# Patient Record
Sex: Male | Born: 1960 | Race: Black or African American | Hispanic: No | Marital: Married | Smoking: Never smoker
Health system: Southern US, Community
[De-identification: ages and names within clinical notes are randomized; demographics above are authoritative.]

## PROBLEM LIST (undated history)

## (undated) DIAGNOSIS — Z973 Presence of spectacles and contact lenses: Secondary | ICD-10-CM

## (undated) DIAGNOSIS — Z5189 Encounter for other specified aftercare: Secondary | ICD-10-CM

## (undated) DIAGNOSIS — M1990 Unspecified osteoarthritis, unspecified site: Secondary | ICD-10-CM

## (undated) DIAGNOSIS — Z974 Presence of external hearing-aid: Secondary | ICD-10-CM

## (undated) DIAGNOSIS — I10 Essential (primary) hypertension: Secondary | ICD-10-CM

## (undated) DIAGNOSIS — G709 Myoneural disorder, unspecified: Secondary | ICD-10-CM

## (undated) HISTORY — PX: CERVICAL DISCECTOMY: SHX98

## (undated) HISTORY — PX: ULNAR NERVE REPAIR: SHX2594

## (undated) HISTORY — PX: TONSILLECTOMY: SUR1361

## (undated) HISTORY — PX: CARPAL TUNNEL RELEASE: SHX101

## (undated) HISTORY — PX: TENDON REPAIR: SHX5111

## (undated) SURGERY — Surgical Case
Anesthesia: *Unknown

---

## 2008-11-28 ENCOUNTER — Encounter (INDEPENDENT_AMBULATORY_CARE_PROVIDER_SITE_OTHER): Payer: Self-pay | Admitting: Orthopedic Surgery

## 2008-11-28 ENCOUNTER — Ambulatory Visit: Payer: Self-pay | Admitting: Pulmonary Disease

## 2008-11-28 ENCOUNTER — Inpatient Hospital Stay (HOSPITAL_COMMUNITY): Admission: RE | Admit: 2008-11-28 | Discharge: 2008-12-03 | Payer: Self-pay | Admitting: Orthopedic Surgery

## 2008-12-22 ENCOUNTER — Inpatient Hospital Stay (HOSPITAL_COMMUNITY): Admission: EM | Admit: 2008-12-22 | Discharge: 2009-01-02 | Payer: Self-pay | Admitting: Emergency Medicine

## 2008-12-22 ENCOUNTER — Ambulatory Visit: Payer: Self-pay | Admitting: Infectious Diseases

## 2008-12-22 DIAGNOSIS — L089 Local infection of the skin and subcutaneous tissue, unspecified: Secondary | ICD-10-CM | POA: Insufficient documentation

## 2008-12-24 ENCOUNTER — Encounter (INDEPENDENT_AMBULATORY_CARE_PROVIDER_SITE_OTHER): Payer: Self-pay | Admitting: Orthopedic Surgery

## 2009-01-16 ENCOUNTER — Ambulatory Visit: Payer: Self-pay | Admitting: Infectious Diseases

## 2009-01-16 DIAGNOSIS — E119 Type 2 diabetes mellitus without complications: Secondary | ICD-10-CM | POA: Insufficient documentation

## 2009-01-17 ENCOUNTER — Encounter: Payer: Self-pay | Admitting: Infectious Diseases

## 2009-01-21 ENCOUNTER — Encounter: Payer: Self-pay | Admitting: Infectious Diseases

## 2009-02-04 ENCOUNTER — Telehealth: Payer: Self-pay | Admitting: Internal Medicine

## 2009-02-06 ENCOUNTER — Emergency Department (HOSPITAL_COMMUNITY): Admission: EM | Admit: 2009-02-06 | Discharge: 2009-02-06 | Payer: Self-pay | Admitting: Emergency Medicine

## 2009-03-11 ENCOUNTER — Ambulatory Visit: Payer: Self-pay | Admitting: Infectious Diseases

## 2009-03-11 LAB — CONVERTED CEMR LAB
Albumin: 4.4 g/dL (ref 3.5–5.2)
BUN: 14 mg/dL (ref 6–23)
Basophils Absolute: 0 10*3/uL (ref 0.0–0.1)
CO2: 25 meq/L (ref 19–32)
CRP: 0.5 mg/dL (ref <0.6)
Calcium: 9.5 mg/dL (ref 8.4–10.5)
Chloride: 104 meq/L (ref 96–112)
Glucose, Bld: 92 mg/dL (ref 70–99)
Hemoglobin: 12 g/dL — ABNORMAL LOW (ref 13.0–17.0)
Lymphocytes Relative: 29 % (ref 12–46)
Monocytes Absolute: 0.5 10*3/uL (ref 0.1–1.0)
Monocytes Relative: 8 % (ref 3–12)
Neutro Abs: 3.7 10*3/uL (ref 1.7–7.7)
Potassium: 4.6 meq/L (ref 3.5–5.3)
RBC: 4.34 M/uL (ref 4.22–5.81)
RDW: 17.4 % — ABNORMAL HIGH (ref 11.5–15.5)
Sed Rate: 14 mm/hr (ref 0–16)
WBC: 6.1 10*3/uL (ref 4.0–10.5)

## 2009-04-08 ENCOUNTER — Ambulatory Visit (HOSPITAL_COMMUNITY): Admission: RE | Admit: 2009-04-08 | Discharge: 2009-04-08 | Payer: Self-pay | Admitting: Orthopedic Surgery

## 2009-06-10 ENCOUNTER — Ambulatory Visit: Payer: Self-pay | Admitting: Infectious Diseases

## 2009-06-10 LAB — CONVERTED CEMR LAB
ALT: 21 units/L (ref 0–53)
AST: 24 units/L (ref 0–37)
Albumin: 4.6 g/dL (ref 3.5–5.2)
Alkaline Phosphatase: 94 units/L (ref 39–117)
Glucose, Bld: 96 mg/dL (ref 70–99)
Hemoglobin: 13.6 g/dL (ref 13.0–17.0)
Platelets: 261 10*3/uL (ref 150–400)
Potassium: 4.3 meq/L (ref 3.5–5.3)
RDW: 14.6 % (ref 11.5–15.5)
Sodium: 140 meq/L (ref 135–145)
Total Protein: 7.1 g/dL (ref 6.0–8.3)

## 2009-08-12 ENCOUNTER — Telehealth: Payer: Self-pay

## 2009-08-12 ENCOUNTER — Ambulatory Visit (HOSPITAL_COMMUNITY): Admission: RE | Admit: 2009-08-12 | Discharge: 2009-08-12 | Payer: Self-pay | Admitting: Orthopedic Surgery

## 2009-08-31 DIAGNOSIS — IMO0001 Reserved for inherently not codable concepts without codable children: Secondary | ICD-10-CM

## 2009-08-31 DIAGNOSIS — Z5189 Encounter for other specified aftercare: Secondary | ICD-10-CM

## 2009-08-31 HISTORY — DX: Reserved for inherently not codable concepts without codable children: IMO0001

## 2009-08-31 HISTORY — DX: Encounter for other specified aftercare: Z51.89

## 2009-11-11 ENCOUNTER — Encounter: Admission: RE | Admit: 2009-11-11 | Discharge: 2009-11-11 | Payer: Self-pay | Admitting: Orthopedic Surgery

## 2009-12-31 ENCOUNTER — Inpatient Hospital Stay (HOSPITAL_COMMUNITY): Admission: RE | Admit: 2009-12-31 | Discharge: 2010-01-03 | Payer: Self-pay | Admitting: Orthopedic Surgery

## 2010-01-01 ENCOUNTER — Ambulatory Visit: Payer: Self-pay | Admitting: Infectious Diseases

## 2010-03-02 IMAGING — CR DG THORACOLUMBAR SPINE 2V
1 series · 1 of 1 positions shown · non-contrast
Comparison: None

CLINICAL DATA: Lumbar stenosis and spondylosis.  T10-S1 PLIF and
TLIF

THORACOLUMBAR SPINE - 2 VIEW

[view not recorded]
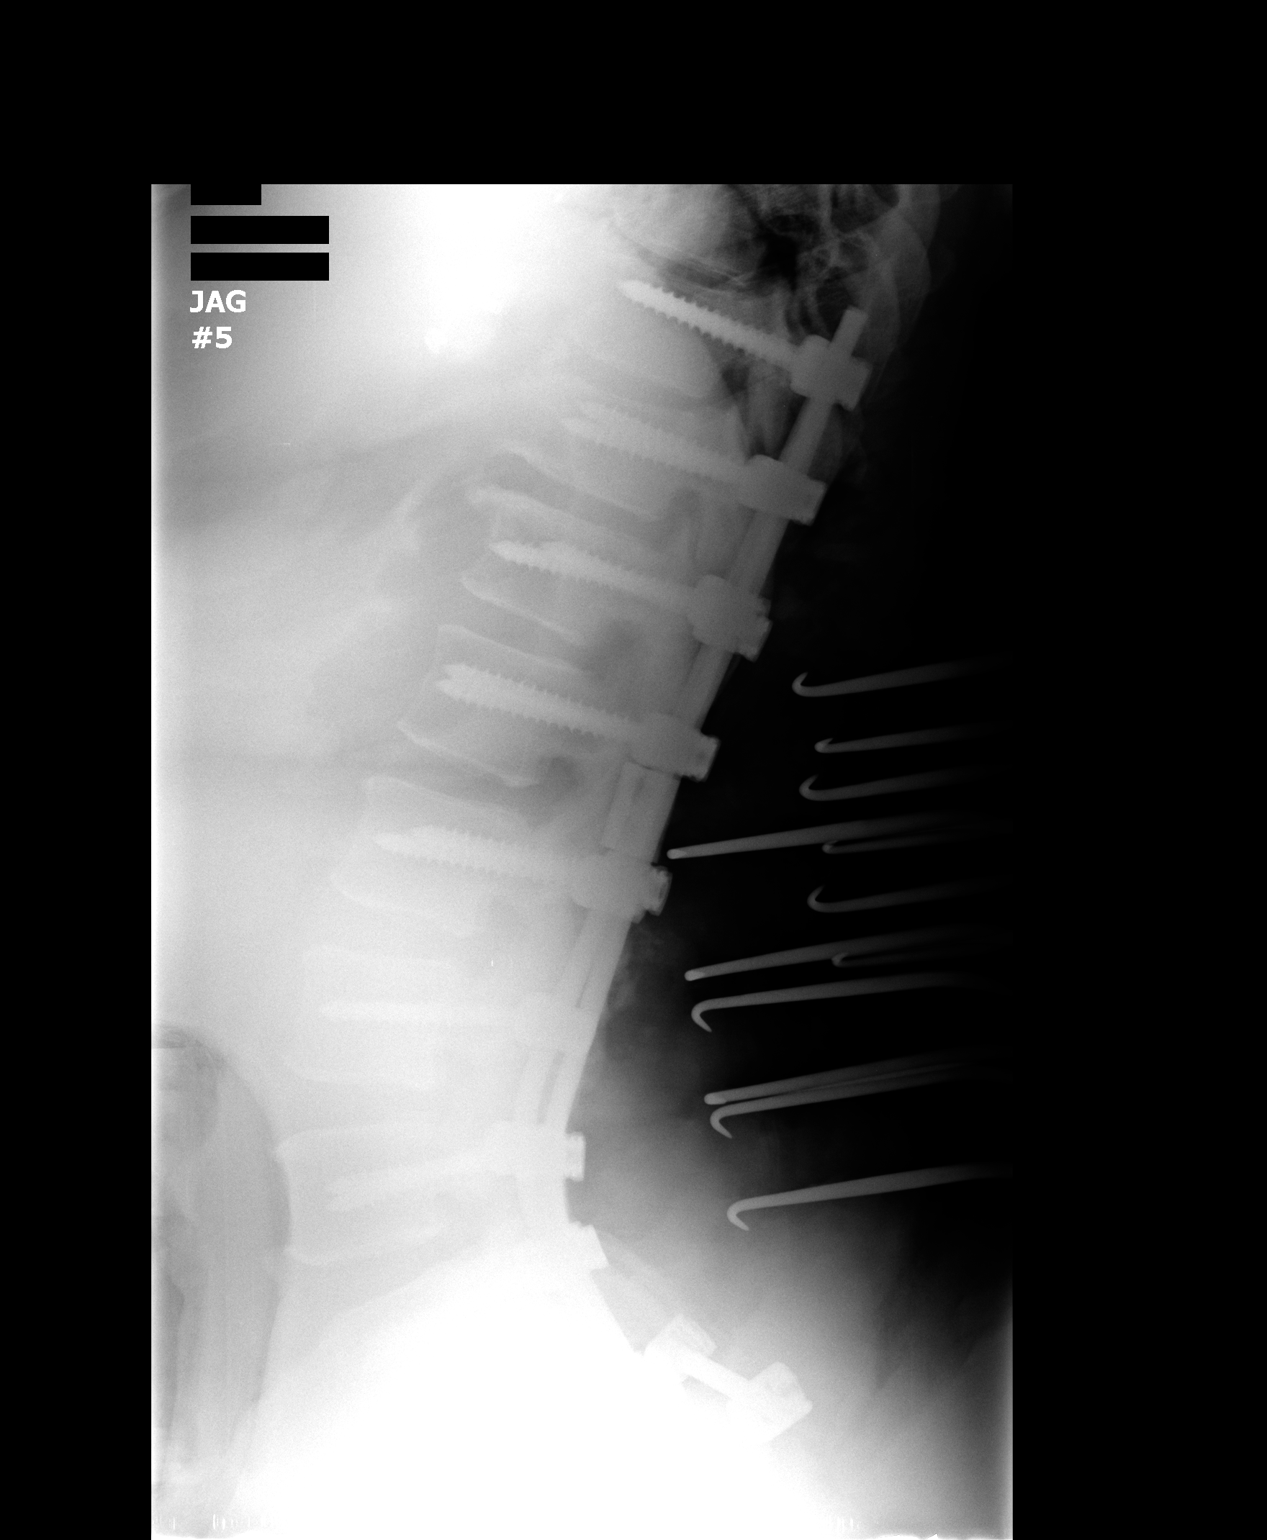

[1 of 1 positions shown; findings below may reference images not displayed]

FINDINGS: Seven radiographs were obtained during the procedure.
The final AP and lateral views reveal paired Transpedicle screws
and posterior interconnecting rods from T10-S1.  Hardware is
intact.  There appears be a separate rod just to the right of
midline extending from L1 to the superior S1 level.  There is a
horizontally oriented rod extending from the distal aspect of the
aforementioned rod to the right and connecting with the base of the
screw which projects over the right medial iliac bone.
IMPRESSION: Status post PLIF and TLIF from T10-S1.

## 2010-03-03 IMAGING — CR DG CHEST 1V PORT
1 series · 1 of 1 positions shown · non-contrast
Comparison: Earlier the same date and 11/28/2008.

CLINICAL DATA: Central line placement.

PORTABLE CHEST - 1 VIEW

[view not recorded]
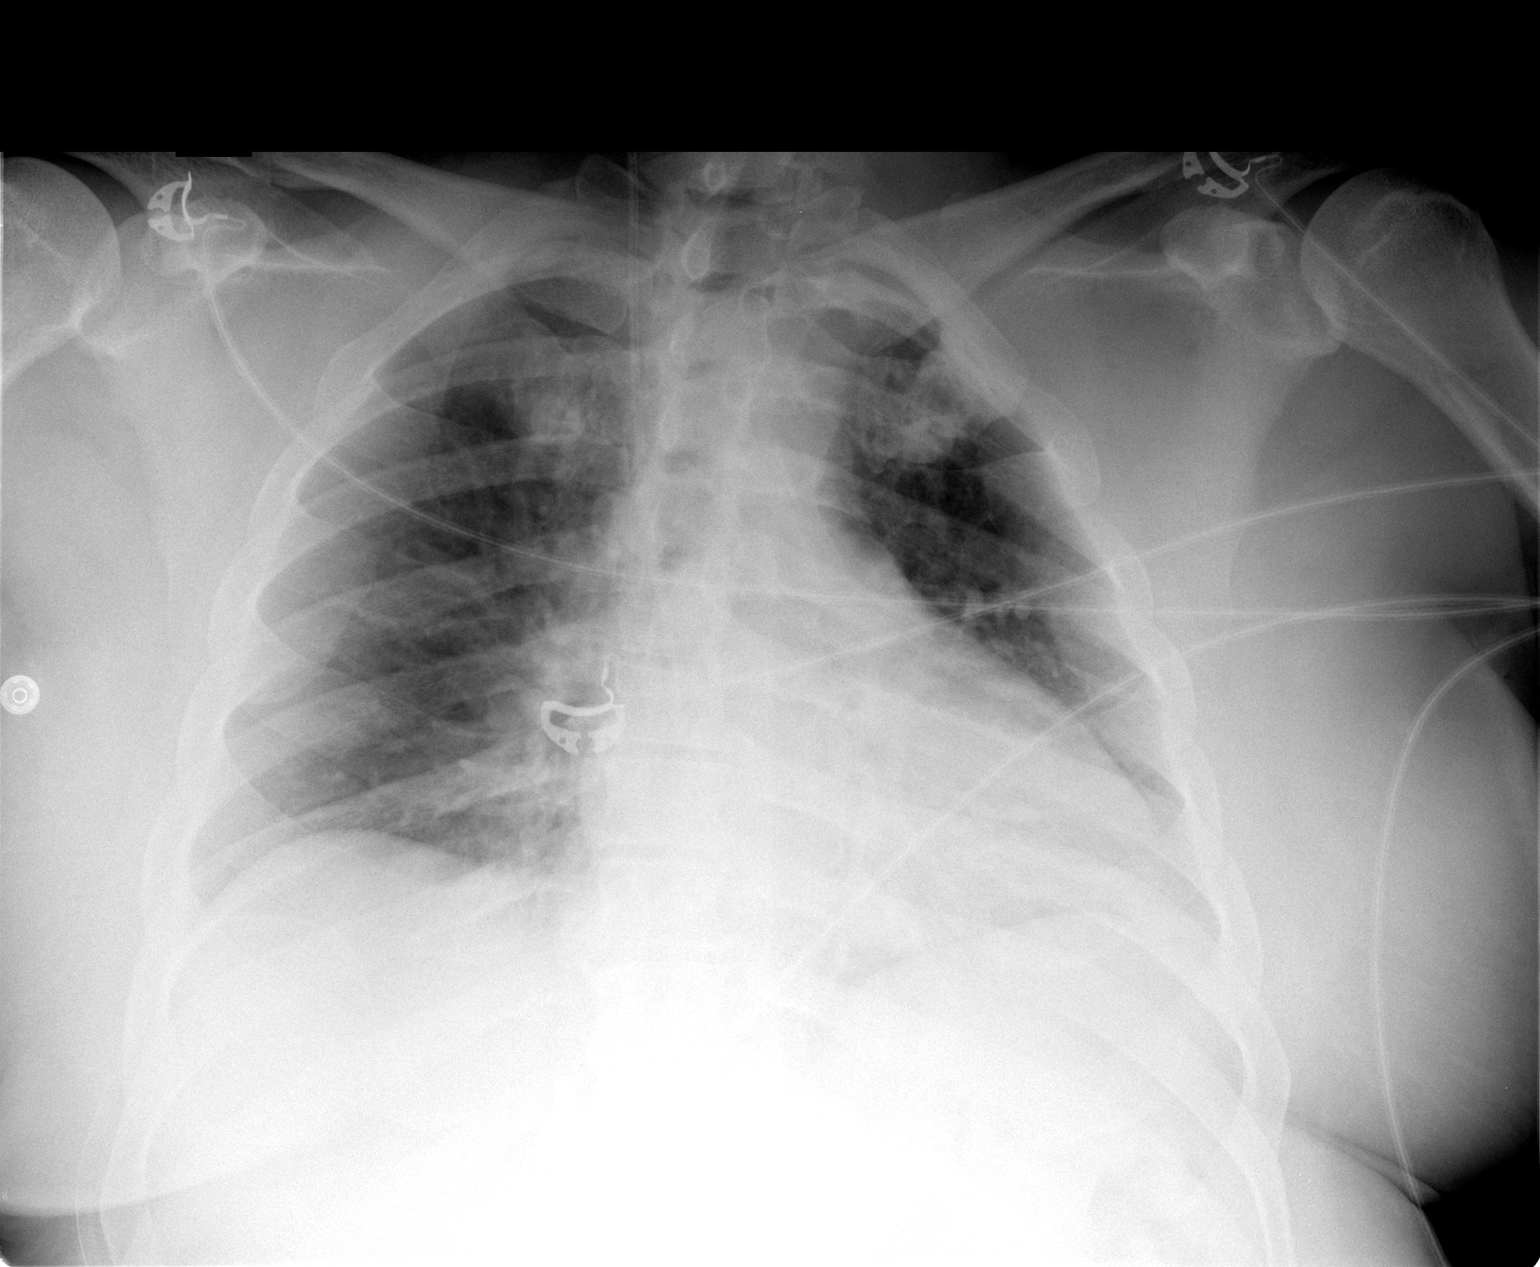

[1 of 1 positions shown; findings below may reference images not displayed]

FINDINGS: 1201 hours.  Right IJ central venous catheter tip is near
the SVC right atrial junction.  The heart size and mediastinal
contours are stable.  There is slightly increased atelectasis in
both lung bases.  No pneumothorax is evident. There has been
interval extubation and removal of the nasogastric tube.
IMPRESSION: Central line tip near the cavoatrial junction.  No pneumothorax.
Mildly increased basilar atelectasis following extubation.

## 2010-03-11 ENCOUNTER — Encounter: Payer: Self-pay | Admitting: Infectious Diseases

## 2010-03-24 ENCOUNTER — Ambulatory Visit: Payer: Self-pay | Admitting: Infectious Diseases

## 2010-03-27 IMAGING — CR DG CHEST 1V PORT
1 series · 1 of 1 positions shown · non-contrast
Comparison: Portable chest x-ray yesterday.

CLINICAL DATA: Bedside PICC placement.  Wound infection.

PORTABLE CHEST - 1 VIEW [DATE]/7525 2655 hours:

[view not recorded]
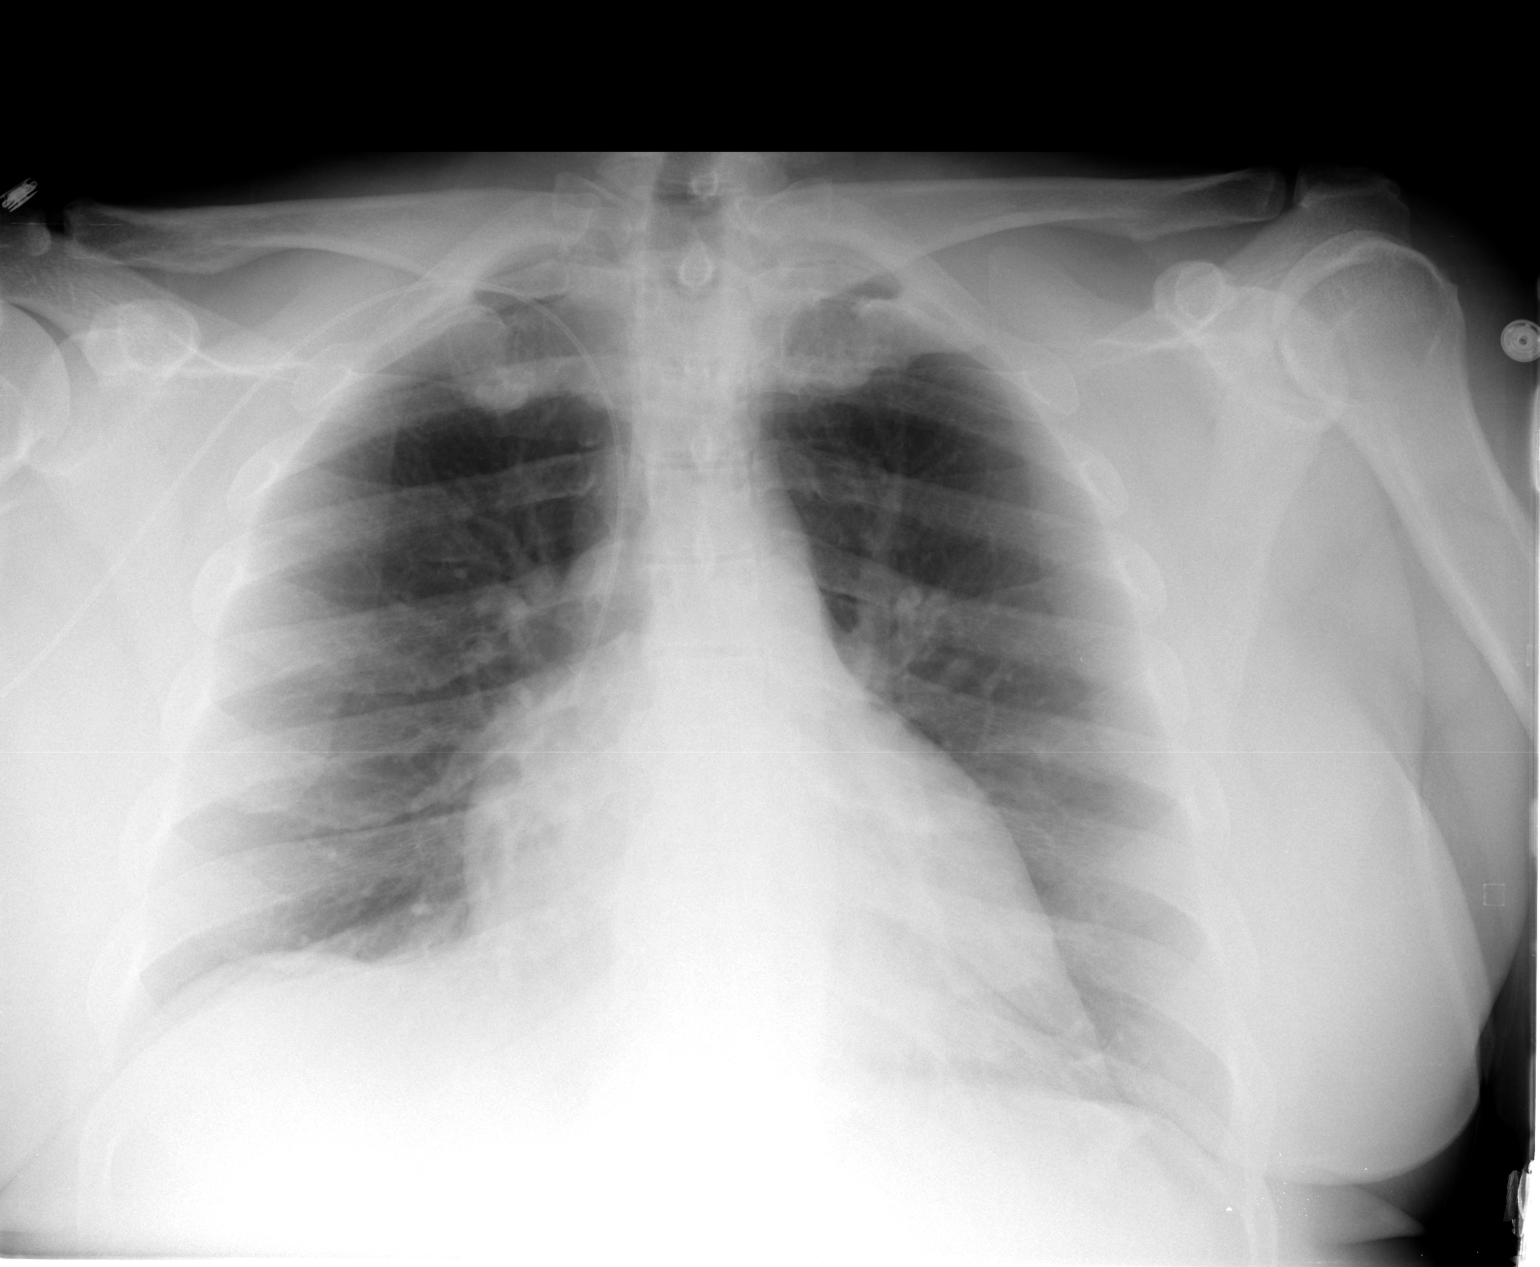

[1 of 1 positions shown; findings below may reference images not displayed]

FINDINGS: Right arm PICC tip in the SVC.  Heart size upper normal
and stable.  Lungs clear.  No pleural effusions.
IMPRESSION: 1.  Right arm PICC tip in the SVC.
2.  No acute cardiopulmonary disease.

## 2010-03-27 IMAGING — US IR US GUIDANCE
1 series · 4 of 4 positions shown · non-contrast
Comparison: MRI dated 12/22/2008

CLINICAL DATA: 47-year-old with fevers and status post
thoracolumbar spine fusion.  Recent MRI demonstrated a large fluid
collection posterior to the spine and consistent with a epidural
fluid collection.

ULTRASOUND GUIDED ASPIRATION

[Series 1: ir us guidance · 4 of 4 slices shown]
[im 1/4]
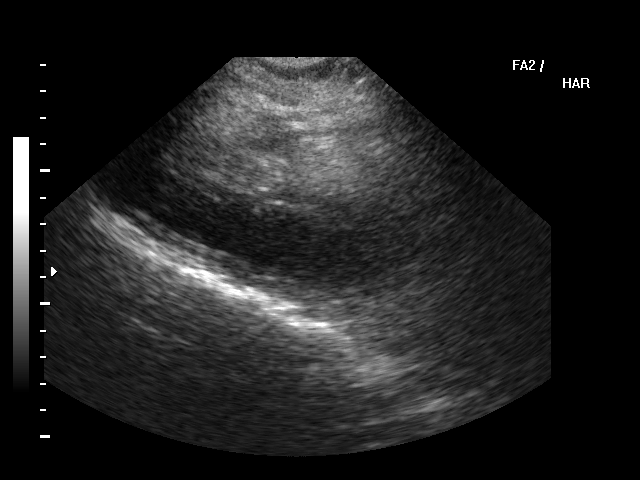
[im 2/4]
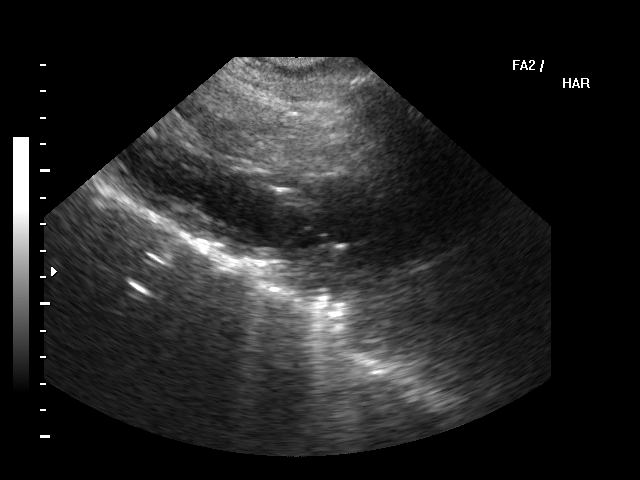
[im 3/4]
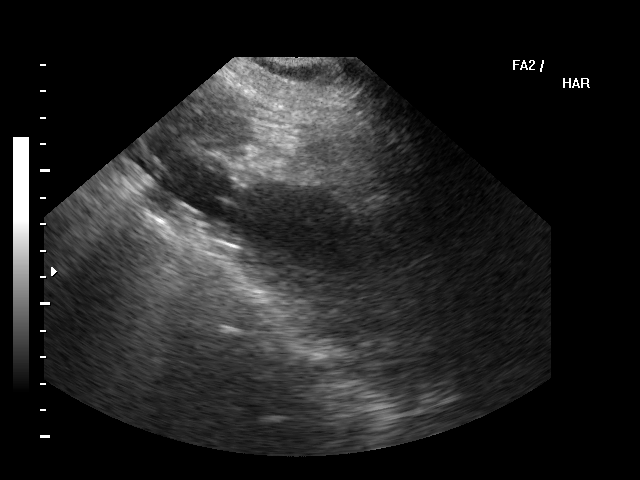
[im 4/4]
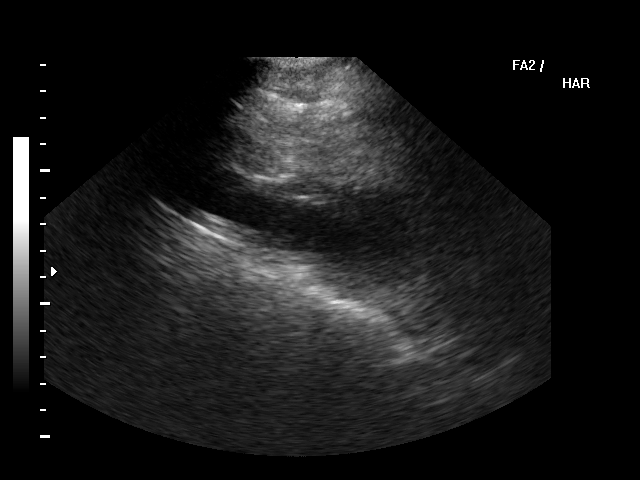

[4 of 4 positions shown; findings below may reference images not displayed]

FINDINGS: The procedure was explained to the patient.  The risks
and benefits of the procedure were discussed and the patient's
questions were addressed.  Informed consent was obtained from the
patient. The patient was placed in a left lateral decubitus
position and the back was evaluated with ultrasound.  A hypoechoic
collection was identified posterior the spine and consistent with
the epidural collection.  Skin was prepped with Betadine.  The skin
was anesthetized with 1% lidocaine.  A 19 gauge Yueh catheter was
directed into the collection with ultrasound guidance.  Needle was
was positioned within the collection.  70 ml of brown and red thin
fluid was removed without complication.  There was residual fluid
in the collection at the end of the procedure.
IMPRESSION: Ultrasound guided aspiration of the posterior epidural collection.
Fluid was sent for chemistries and culture.

## 2010-04-08 ENCOUNTER — Encounter: Payer: Self-pay | Admitting: Infectious Diseases

## 2010-06-16 ENCOUNTER — Ambulatory Visit (HOSPITAL_COMMUNITY): Admission: RE | Admit: 2010-06-16 | Discharge: 2010-06-16 | Payer: Self-pay | Admitting: Orthopedic Surgery

## 2010-07-02 ENCOUNTER — Encounter: Payer: Self-pay | Admitting: Infectious Diseases

## 2010-07-07 ENCOUNTER — Ambulatory Visit: Payer: Self-pay | Admitting: Infectious Diseases

## 2010-08-31 HISTORY — PX: BACK SURGERY: SHX140

## 2010-09-30 NOTE — Assessment & Plan Note (Signed)
Summary: F/U [MKJ]   CC:  f/u back infection .  History of Present Illness: 51 yo M with hx of T10 to S1 fusion on 11-28-08. He returned 4-24 with fever. Was found on MRI to have  Very large, mildly septated fluid collection encompassing 22 x  9 x 7 cm and spanning the levels T10-L5. He underwent I & D of this on 4-26 and 12-26-08 with palcement of antibiotic beads. His Cx from 4-26 gew CNS. He was d/c home on vancomycin and rifampin. Had significant amt of n/v after d/c. His rifampin was stopped. This stopped his nausea but then he still had some emesis post eating. He was then changed to cubicin and has felt better. Wound was well healed, stitches out 01-09-09.  He completed his IV antibiotics June 6 and was changed to by mouth bactrim at that time.  having alot of back spasms for the past few weeks. has taken demerol/valium/robaxin. has improved the pain but incompletely. no fevers or chills. no paresthesias in feet. nl bm, urination nl.   He had T3-T10 fusion in May 2011.   He was seen by ID 06-10-09 and again had Nl CBC, ESR and CRP. Had f/u CRP at Dr Toni Arthurs office may 2011 that was elavated. He was seen in ID clinic July and was contiued on his Bactrim. He continues to have pain in his back, worse with standing up straight. No fevers or chills. Has pain in his whole left leg, numbness and tingling in his L leg and arm.  Had f/u myelogram Jun 16, 2010: These findings are similar to the   prior myelogram.  Mild disc degeneration at L1-2, L2-3, L3-4, and   L4-5.  Negative for fracture or mass.  No fluid collection is present and   there is no evidence of osteomyelitis.  Preventive Screening-Counseling & Management  Alcohol-Tobacco     Alcohol drinks/day: 0     Smoking Status: never  Current Medications (verified): 1)  Lisinopril 10 Mg Tabs (Lisinopril) .... Take 1 Tablet By Mouth Once A Day 2)  Glucophage 500 Mg Tabs (Metformin Hcl) .... Take 1 Tablet By Mouth Once A Day 3)  Bactrim Ds  800-160 Mg Tabs (Sulfamethoxazole-Trimethoprim) .... Take 1 Tablet By Mouth Two Times A Day 4)  Robaxin 500 Mg Tabs (Methocarbamol) .Marland Kitchen.. 1-2  Q6h As Needed 5)  Valium 5 Mg Tabs (Diazepam) .Marland Kitchen.. 1-2 Q4h As Needed 6)  Demerol 50 Mg Tabs (Meperidine Hcl) .... Q4h Tab As Needed 7)  Duragesic-50 50 Mcg/hr Pt72 (Fentanyl) .... Q72h 8)  Temazepam 15 Mg Caps (Temazepam) .... Take 1 Tablet By Mouth At Bedtime 9)  Mag-Ox 400 400 Mg Tabs (Magnesium Oxide) .... Qdtab 10)  Vitamin D 2000 Unit Tabs (Cholecalciferol) .... Take 1 Tablet By Mouth Once A Day  Allergies: 1)  ! * Rifampin 2)  ! Vancomycin    Updated Prior Medication List: LISINOPRIL 10 MG TABS (LISINOPRIL) Take 1 tablet by mouth once a day GLUCOPHAGE 500 MG TABS (METFORMIN HCL) Take 1 tablet by mouth once a day BACTRIM DS 800-160 MG TABS (SULFAMETHOXAZOLE-TRIMETHOPRIM) Take 1 tablet by mouth two times a day ROBAXIN 500 MG TABS (METHOCARBAMOL) 1-2  q6h as needed VALIUM 5 MG TABS (DIAZEPAM) 1-2 q4h as needed DEMEROL 50 MG TABS (MEPERIDINE HCL) q4h tab as needed DURAGESIC-50 50 MCG/HR PT72 (FENTANYL) q72h TEMAZEPAM 15 MG CAPS (TEMAZEPAM) Take 1 tablet by mouth at bedtime MAG-OX 400 400 MG TABS (MAGNESIUM OXIDE) qdtab VITAMIN D 2000 UNIT TABS (  CHOLECALCIFEROL) Take 1 tablet by mouth once a day  Current Allergies (reviewed today): ! * RIFAMPIN ! VANCOMYCIN Past History:  Past medical, surgical, family and social histories (including risk factors) reviewed, and no changes noted (except as noted below).  Past Medical History: Reviewed history from 01/16/2009 and no changes required. 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 Family History Diabetes 1st degree relative- mom Family History Hypertension- mom, brother  Social History: Reviewed history from 01/16/2009 and no changes required. Married Never Smoked Alcohol use-no  Review of  Systems       wt up 20#. states he is not eating much, just notable to exert himself. FSG 70-80. Last A1C 5.3.   Vital Signs:  Patient profile:   50 year old male Height:      71 inches (180.34 cm) Weight:      294.50 pounds (133.86 kg) BMI:     41.22 Temp:     98.6 degrees F (37.00 degrees C) oral Pulse rate:   86 / minute BP sitting:   126 / 82  (left arm)  Vitals Entered By: Starleen Arms CMA (July 07, 2010 10:35 AM) CC: f/u back infection  Pain Assessment Patient in pain? yes     Location: back, hips, arms Intensity: 8 Type: aching Nutritional Status BMI of > 30 = obese  Does patient need assistance? Functional Status Self care   Physical Exam  General:  well-developed, well-nourished, well-hydrated, and overweight-appearing.     Detailed Back/Spine Exam  Inspection:    he has a scar the entire length of his back. there is some tenderness at the highest and lowest points. the scar is well healed and closed. there is no increase in heat.    Impression & Recommendations:  Problem # 1:  WOUND INFECTION (ICD-686.9)  He has a large amount of residual pain. From an Infectious point of view, he appears to be doing well. States he had a nl CRP and ESR after his last surgery. Will stop his bactrim and watch. He will return to clinic as needed   Orders: Est. Patient Level III (96295)  Problem # 2:  AODM (ICD-250.00)  he appears to be doing very well, needs to loose wt.  His updated medication list for this problem includes:    Lisinopril 10 Mg Tabs (Lisinopril) .Marland Kitchen... Take 1 tablet by mouth once a day    Glucophage 500 Mg Tabs (Metformin hcl) .Marland Kitchen... Take 1 tablet by mouth once a day  Orders: Est. Patient Level III (28413)

## 2010-09-30 NOTE — Miscellaneous (Signed)
Summary: [facility]  [facility]   Imported By: Florinda Marker 04/10/2010 10:35:41  _____________________________________________________________________  External Attachment:    Type:   Image     Comment:   External Document

## 2010-09-30 NOTE — Miscellaneous (Signed)
Summary: [facility]   Imported By: Florinda Marker 07/04/2010 14:48:52  _____________________________________________________________________  External Attachment:    Type:   Image     Comment:   External Document

## 2010-09-30 NOTE — Assessment & Plan Note (Signed)
Summary: f/u ov/vs   History of Present Illness: 50 yo M with hx of T10 to S1 fusion on 11-28-08. He returned 4-24 with fever. Was found on MRI to have  Very large, mildly septated fluid collection encompassing 22 x  9 x 7 cm and spanning the levels T10-L5. He underwent I & D of this on 4-26 and 12-26-08 with palcement of antibiotic beads. His Cx from 4-26 gew CNS. He was d/c home on vancomycin and rifampin. Had significant amt of n/v after d/c. His rifampin was stopped. This stopped his nausea but then he still had some emesis post eating. He was then changed to cubicin and has felt better. Wound was well healed, stitches out 01-09-09.  He completed his IV antibiotics June 6 and was changed to by mouth bactrim at that time.  having alot of back spasms for the past few weeks. has taken demerol/valium/robaxin. has improved the pain but incompletely. no fevers or chills. no paresthesias in feet. nl bm, urination nl.   Had f/u with surgery 6-28 with plain films done showing L5-S1 has subsided into L5 body. Previous CT scan 6-10 showing resorption around the cage and L S1 screw is loose.   At previous ID f/u ESR and CRP were nl. He was seen by ID 06-10-09 and again had Nl CBC, ESR and CRP. States his back is "ok", noticing cracking and popping. Has feeling that everything locks up after sitting for prolonged periods. Having pain that radiates from the top of his shoulder, down his L arm and hands. Has numbness and tingling in his L hand.  Had trial of baclofen which only caused him to have GI upset. Last imaging was CT myelo November 11, 2009 (1.  Minimal osseous foraminal narrowing bilaterally at L3-4 is   stable.  2.  Status post fusion as described. 3.  Minimal lucency about the left S1 screw is unchanged.  4.  Minimal encroachment along the posterior aspect of the left  lateral recess at L1-2 without significant stenosis).   had f/u surgery 5-3-11for extension of his fusion to T3. He had a JP drain cx done  then that was negative.  Labs from Dr Toni Arthurs office 03-11-10 WBC 5.5, CRP 2.3 (01-15-10).   Updated Prior Medication List: LISINOPRIL 10 MG TABS (LISINOPRIL) Take 1 tablet by mouth once a day GLUCOPHAGE 500 MG TABS (METFORMIN HCL) Take 1 tablet by mouth once a day BACTRIM DS 800-160 MG TABS (SULFAMETHOXAZOLE-TRIMETHOPRIM) Take 1 tablet by mouth two times a day ROBAXIN 500 MG TABS (METHOCARBAMOL) 1-2  q6h as needed VALIUM 5 MG TABS (DIAZEPAM) 1-2 q4h as needed DEMEROL 50 MG TABS (MEPERIDINE HCL) q4h tab as needed DURAGESIC-50 50 MCG/HR PT72 (FENTANYL) q72h TEMAZEPAM 15 MG CAPS (TEMAZEPAM) Take 1 tablet by mouth at bedtime MAG-OX 400 400 MG TABS (MAGNESIUM OXIDE) qdtab VITAMIN D 2000 UNIT TABS (CHOLECALCIFEROL) Take 1 tablet by mouth once a day  Current Allergies (reviewed today): ! * RIFAMPIN ! VANCOMYCIN Vital Signs:  Patient profile:   50 year old male Height:      71 inches Weight:      273 pounds Pulse rate:   80 / minute  Physical Exam  General:  well-developed, well-nourished, well-hydrated, and overweight-appearing.   Lungs:  normal respiratory effort and normal breath sounds.   Heart:  normal rate, regular rhythm, and no murmur.   Abdomen:  soft, non-tender, and normal bowel sounds.     Detailed Back/Spine Exam  Inspection:  wound is well healed, non-tender. there is increased heat which I believe is due to his brace and clothing. there is no fluctuance.    Impression & Recommendations:  Problem # 1:  WOUND INFECTION (ICD-686.9)   Somewhat unclear that he still has infection in his spine. Not clear that the increased CRP was from his recent surgery.  He does have lucency of the screw at S1 but his blood work that we have has been normal. Will plan to continue his bactrim and see him back in November (which will be 6 months from his repeat surgery in May). If he has not had repeat imaging done by then, will reimage his back.   Orders: Est. Patient Level III  (81191)  Medications Added to Medication List This Visit: 1)  Temazepam 15 Mg Caps (Temazepam) .... Take 1 tablet by mouth at bedtime 2)  Mag-ox 400 400 Mg Tabs (Magnesium oxide) .... Qdtab 3)  Vitamin D 2000 Unit Tabs (Cholecalciferol) .... Take 1 tablet by mouth once a day

## 2010-11-06 ENCOUNTER — Encounter: Payer: Self-pay | Admitting: Licensed Clinical Social Worker

## 2010-11-18 LAB — WOUND CULTURE: Culture: NO GROWTH

## 2010-11-18 LAB — COMPREHENSIVE METABOLIC PANEL
ALT: 24 U/L (ref 0–53)
Alkaline Phosphatase: 76 U/L (ref 39–117)
BUN: 18 mg/dL (ref 6–23)
CO2: 25 mEq/L (ref 19–32)
Chloride: 107 mEq/L (ref 96–112)
Glucose, Bld: 91 mg/dL (ref 70–99)
Potassium: 4.2 mEq/L (ref 3.5–5.1)
Sodium: 138 mEq/L (ref 135–145)
Total Bilirubin: 0.7 mg/dL (ref 0.3–1.2)
Total Protein: 7.2 g/dL (ref 6.0–8.3)

## 2010-11-18 LAB — MRSA PCR SCREENING: MRSA by PCR: NEGATIVE

## 2010-11-18 LAB — CBC
HCT: 25.6 % — ABNORMAL LOW (ref 39.0–52.0)
HCT: 33.4 % — ABNORMAL LOW (ref 39.0–52.0)
HCT: 36.2 % — ABNORMAL LOW (ref 39.0–52.0)
HCT: 39.6 % (ref 39.0–52.0)
Hemoglobin: 8.7 g/dL — ABNORMAL LOW (ref 13.0–17.0)
Hemoglobin: 13.9 g/dL (ref 13.0–17.0)
MCHC: 34.3 g/dL (ref 30.0–36.0)
MCHC: 35.3 g/dL (ref 30.0–36.0)
MCV: 92.2 fL (ref 78.0–100.0)
MCV: 92.2 fL (ref 78.0–100.0)
Platelets: 146 10*3/uL — ABNORMAL LOW (ref 150–400)
Platelets: 155 10*3/uL (ref 150–400)
Platelets: 176 10*3/uL (ref 150–400)
RBC: 3.92 MIL/uL — ABNORMAL LOW (ref 4.22–5.81)
RBC: 4.31 MIL/uL (ref 4.22–5.81)
RDW: 13.3 % (ref 11.5–15.5)
RDW: 13.5 % (ref 11.5–15.5)
WBC: 9 10*3/uL (ref 4.0–10.5)
WBC: 9.8 10*3/uL (ref 4.0–10.5)
WBC: 6.3 10*3/uL (ref 4.0–10.5)

## 2010-11-18 LAB — POCT I-STAT 4, (NA,K, GLUC, HGB,HCT)
Glucose, Bld: 120 mg/dL — ABNORMAL HIGH (ref 70–99)
Glucose, Bld: 87 mg/dL (ref 70–99)
HCT: 43 % (ref 39.0–52.0)
Hemoglobin: 10.9 g/dL — ABNORMAL LOW (ref 13.0–17.0)
Hemoglobin: 14.6 g/dL (ref 13.0–17.0)
Potassium: 4.1 mEq/L (ref 3.5–5.1)
Potassium: 4.9 mEq/L (ref 3.5–5.1)
Sodium: 141 mEq/L (ref 135–145)

## 2010-11-18 LAB — URINE MICROSCOPIC-ADD ON

## 2010-11-18 LAB — BASIC METABOLIC PANEL
BUN: 11 mg/dL (ref 6–23)
BUN: 14 mg/dL (ref 6–23)
BUN: 6 mg/dL (ref 6–23)
BUN: 9 mg/dL (ref 6–23)
CO2: 27 mEq/L (ref 19–32)
Calcium: 7.6 mg/dL — ABNORMAL LOW (ref 8.4–10.5)
Chloride: 105 mEq/L (ref 96–112)
Chloride: 105 mEq/L (ref 96–112)
Creatinine, Ser: 0.72 mg/dL (ref 0.4–1.5)
Creatinine, Ser: 0.81 mg/dL (ref 0.4–1.5)
GFR calc non Af Amer: >60 mL/min (ref >60)
GFR calc non Af Amer: >60 mL/min (ref >60)
GFR calc non Af Amer: >60 mL/min (ref >60)
Glucose, Bld: 103 mg/dL — ABNORMAL HIGH (ref 70–99)
Potassium: 4 mEq/L (ref 3.5–5.1)
Potassium: 4.1 mEq/L (ref 3.5–5.1)
Potassium: 4.2 mEq/L (ref 3.5–5.1)
Sodium: 136 mEq/L (ref 135–145)

## 2010-11-18 LAB — BLOOD GAS, ARTERIAL
Bicarbonate: 23.5 mEq/L (ref 20.0–24.0)
TCO2: 24.8 mmol/L (ref 0–100)
pCO2 arterial: 41.7 mmHg (ref 35.0–45.0)
pH, Arterial: 7.37 (ref 7.350–7.450)

## 2010-11-18 LAB — PROTIME-INR
INR: 1.32 (ref 0.00–1.49)
INR: 1.43 (ref 0.00–1.49)
Prothrombin Time: 16.2 seconds — ABNORMAL HIGH (ref 11.6–15.2)
Prothrombin Time: 16.3 seconds — ABNORMAL HIGH (ref 11.6–15.2)
Prothrombin Time: 17.3 seconds — ABNORMAL HIGH (ref 11.6–15.2)
Prothrombin Time: 14 seconds (ref 11.6–15.2)

## 2010-11-18 LAB — DIFFERENTIAL
Basophils Absolute: 0 10*3/uL (ref 0.0–0.1)
Basophils Absolute: 0 10*3/uL (ref 0.0–0.1)
Basophils Absolute: 0 10*3/uL (ref 0.0–0.1)
Basophils Relative: 0 % (ref 0–1)
Basophils Relative: 1 % (ref 0–1)
Eosinophils Absolute: 0.1 10*3/uL (ref 0.0–0.7)
Eosinophils Relative: 0 % (ref 0–5)
Lymphocytes Relative: 11 % — ABNORMAL LOW (ref 12–46)
Lymphocytes Relative: 13 % (ref 12–46)
Lymphs Abs: 1.3 10*3/uL (ref 0.7–4.0)
Monocytes Absolute: 0.8 10*3/uL (ref 0.1–1.0)
Monocytes Relative: 8 % (ref 3–12)
Monocytes Relative: 8 % (ref 3–12)
Neutro Abs: 7.6 10*3/uL (ref 1.7–7.7)
Neutro Abs: 8.7 10*3/uL — ABNORMAL HIGH (ref 1.7–7.7)
Neutro Abs: 3.8 10*3/uL (ref 1.7–7.7)
Neutrophils Relative %: 61 % (ref 43–77)

## 2010-11-18 LAB — URINALYSIS, ROUTINE W REFLEX MICROSCOPIC
Glucose, UA: NEGATIVE mg/dL
Ketones, ur: NEGATIVE mg/dL
Leukocytes, UA: NEGATIVE
Nitrite: NEGATIVE
Specific Gravity, Urine: 1.026 (ref 1.005–1.030)
pH: 5.5 (ref 5.0–8.0)

## 2010-11-18 LAB — GLUCOSE, CAPILLARY
Glucose-Capillary: 110 mg/dL — ABNORMAL HIGH (ref 70–99)
Glucose-Capillary: 118 mg/dL — ABNORMAL HIGH (ref 70–99)
Glucose-Capillary: 86 mg/dL (ref 70–99)
Glucose-Capillary: 86 mg/dL (ref 70–99)
Glucose-Capillary: 94 mg/dL (ref 70–99)
Glucose-Capillary: 99 mg/dL (ref 70–99)
Glucose-Capillary: 99 mg/dL (ref 70–99)

## 2010-11-18 LAB — VITAMIN D 25 HYDROXY (VIT D DEFICIENCY, FRACTURES): Vit D, 25-Hydroxy: 27 ng/mL — ABNORMAL LOW (ref 30–89)

## 2010-11-18 LAB — APTT: aPTT: 33 seconds (ref 24–37)

## 2010-11-27 ENCOUNTER — Encounter: Payer: Self-pay | Admitting: *Deleted

## 2010-12-08 LAB — DIFFERENTIAL
Basophils Relative: 1 % (ref 0–1)
Eosinophils Absolute: 0.1 10*3/uL (ref 0.0–0.7)
Lymphs Abs: 1.9 10*3/uL (ref 0.7–4.0)
Monocytes Absolute: 0.5 10*3/uL (ref 0.1–1.0)
Monocytes Relative: 7 % (ref 3–12)
Neutrophils Relative %: 64 % (ref 43–77)

## 2010-12-08 LAB — CBC
HCT: 34.6 % — ABNORMAL LOW (ref 39.0–52.0)
Hemoglobin: 11.5 g/dL — ABNORMAL LOW (ref 13.0–17.0)
MCHC: 33.3 g/dL (ref 30.0–36.0)
MCV: 84.1 fL (ref 78.0–100.0)
RBC: 4.11 MIL/uL — ABNORMAL LOW (ref 4.22–5.81)
RDW: 16.2 % — ABNORMAL HIGH (ref 11.5–15.5)

## 2010-12-09 LAB — DIFFERENTIAL
Basophils Absolute: 0 10*3/uL (ref 0.0–0.1)
Basophils Absolute: 0 10*3/uL (ref 0.0–0.1)
Basophils Relative: 0 % (ref 0–1)
Basophils Relative: 0 % (ref 0–1)
Basophils Relative: 1 % (ref 0–1)
Eosinophils Absolute: 0.4 10*3/uL (ref 0.0–0.7)
Eosinophils Relative: 1 % (ref 0–5)
Lymphocytes Relative: 12 % (ref 12–46)
Lymphocytes Relative: 14 % (ref 12–46)
Lymphocytes Relative: 17 % (ref 12–46)
Lymphs Abs: 1.7 10*3/uL (ref 0.7–4.0)
Lymphs Abs: 1.9 10*3/uL (ref 0.7–4.0)
Lymphs Abs: 2 10*3/uL (ref 0.7–4.0)
Monocytes Absolute: 0.4 10*3/uL (ref 0.1–1.0)
Monocytes Absolute: 0.7 10*3/uL (ref 0.1–1.0)
Monocytes Absolute: 0.7 10*3/uL (ref 0.1–1.0)
Monocytes Absolute: 0.8 10*3/uL (ref 0.1–1.0)
Monocytes Absolute: 0.9 10*3/uL (ref 0.1–1.0)
Monocytes Relative: 3 % (ref 3–12)
Monocytes Relative: 5 % (ref 3–12)
Monocytes Relative: 7 % (ref 3–12)
Monocytes Relative: 7 % (ref 3–12)
Neutro Abs: 7.6 10*3/uL (ref 1.7–7.7)
Neutro Abs: 7.9 10*3/uL — ABNORMAL HIGH (ref 1.7–7.7)
Neutro Abs: 9.2 10*3/uL — ABNORMAL HIGH (ref 1.7–7.7)
Neutrophils Relative %: 65 % (ref 43–77)
Neutrophils Relative %: 71 % (ref 43–77)
Neutrophils Relative %: 72 % (ref 43–77)
Neutrophils Relative %: 77 % (ref 43–77)

## 2010-12-09 LAB — CBC
HCT: 27.2 % — ABNORMAL LOW (ref 39.0–52.0)
HCT: 30.7 % — ABNORMAL LOW (ref 39.0–52.0)
Hemoglobin: 10.5 g/dL — ABNORMAL LOW (ref 13.0–17.0)
Hemoglobin: 9.2 g/dL — ABNORMAL LOW (ref 13.0–17.0)
Hemoglobin: 9.3 g/dL — ABNORMAL LOW (ref 13.0–17.0)
Hemoglobin: 9.5 g/dL — ABNORMAL LOW (ref 13.0–17.0)
MCHC: 34.4 g/dL (ref 30.0–36.0)
MCHC: 34.5 g/dL (ref 30.0–36.0)
MCHC: 34.9 g/dL (ref 30.0–36.0)
MCV: 85 fL (ref 78.0–100.0)
MCV: 85.5 fL (ref 78.0–100.0)
Platelets: 444 10*3/uL — ABNORMAL HIGH (ref 150–400)
RBC: 3.11 MIL/uL — ABNORMAL LOW (ref 4.22–5.81)
RBC: 3.19 MIL/uL — ABNORMAL LOW (ref 4.22–5.81)
RBC: 3.21 MIL/uL — ABNORMAL LOW (ref 4.22–5.81)
RBC: 3.58 MIL/uL — ABNORMAL LOW (ref 4.22–5.81)
RDW: 14.2 % (ref 11.5–15.5)
RDW: 14.7 % (ref 11.5–15.5)
RDW: 14.7 % (ref 11.5–15.5)
WBC: 11 10*3/uL — ABNORMAL HIGH (ref 4.0–10.5)
WBC: 12 10*3/uL — ABNORMAL HIGH (ref 4.0–10.5)
WBC: 15 10*3/uL — ABNORMAL HIGH (ref 4.0–10.5)

## 2010-12-09 LAB — GLUCOSE, CAPILLARY
Glucose-Capillary: 132 mg/dL — ABNORMAL HIGH (ref 70–99)
Glucose-Capillary: 71 mg/dL (ref 70–99)
Glucose-Capillary: 81 mg/dL (ref 70–99)
Glucose-Capillary: 82 mg/dL (ref 70–99)
Glucose-Capillary: 84 mg/dL (ref 70–99)
Glucose-Capillary: 84 mg/dL (ref 70–99)
Glucose-Capillary: 88 mg/dL (ref 70–99)
Glucose-Capillary: 89 mg/dL (ref 70–99)
Glucose-Capillary: 93 mg/dL (ref 70–99)
Glucose-Capillary: 93 mg/dL (ref 70–99)

## 2010-12-09 LAB — BASIC METABOLIC PANEL
BUN: 3 mg/dL — ABNORMAL LOW (ref 6–23)
BUN: 4 mg/dL — ABNORMAL LOW (ref 6–23)
CO2: 28 mEq/L (ref 19–32)
CO2: 28 mEq/L (ref 19–32)
CO2: 29 mEq/L (ref 19–32)
Calcium: 8.1 mg/dL — ABNORMAL LOW (ref 8.4–10.5)
Calcium: 8.3 mg/dL — ABNORMAL LOW (ref 8.4–10.5)
Chloride: 104 mEq/L (ref 96–112)
Chloride: 105 mEq/L (ref 96–112)
Chloride: 106 mEq/L (ref 96–112)
Chloride: 106 mEq/L (ref 96–112)
Creatinine, Ser: 0.78 mg/dL (ref 0.4–1.5)
Creatinine, Ser: 0.86 mg/dL (ref 0.4–1.5)
Creatinine, Ser: 0.9 mg/dL (ref 0.4–1.5)
Creatinine, Ser: 0.93 mg/dL (ref 0.4–1.5)
GFR calc Af Amer: >60 mL/min (ref >60)
GFR calc Af Amer: >60 mL/min (ref >60)
GFR calc non Af Amer: >60 mL/min (ref >60)
GFR calc non Af Amer: >60 mL/min (ref >60)
Glucose, Bld: 100 mg/dL — ABNORMAL HIGH (ref 70–99)
Glucose, Bld: 77 mg/dL (ref 70–99)
Potassium: 3.3 mEq/L — ABNORMAL LOW (ref 3.5–5.1)
Sodium: 137 mEq/L (ref 135–145)
Sodium: 141 mEq/L (ref 135–145)

## 2010-12-10 LAB — COMPREHENSIVE METABOLIC PANEL
Albumin: 2.7 g/dL — ABNORMAL LOW (ref 3.5–5.2)
BUN: 13 mg/dL (ref 6–23)
BUN: 8 mg/dL (ref 6–23)
CO2: 27 mEq/L (ref 19–32)
Calcium: 8.4 mg/dL (ref 8.4–10.5)
Creatinine, Ser: 0.97 mg/dL (ref 0.4–1.5)
Creatinine, Ser: 1.12 mg/dL (ref 0.4–1.5)
GFR calc Af Amer: >60 mL/min (ref >60)
GFR calc Af Amer: >60 mL/min (ref >60)
GFR calc non Af Amer: >60 mL/min (ref >60)
Glucose, Bld: 90 mg/dL (ref 70–99)
Potassium: 4.2 mEq/L (ref 3.5–5.1)
Total Bilirubin: 0.9 mg/dL (ref 0.3–1.2)
Total Protein: 6.7 g/dL (ref 6.0–8.3)

## 2010-12-10 LAB — DIFFERENTIAL
Basophils Absolute: 0 10*3/uL (ref 0.0–0.1)
Basophils Absolute: 0 10*3/uL (ref 0.0–0.1)
Basophils Absolute: 0 10*3/uL (ref 0.0–0.1)
Basophils Absolute: 0.1 10*3/uL (ref 0.0–0.1)
Basophils Relative: 0 % (ref 0–1)
Basophils Relative: 0 % (ref 0–1)
Basophils Relative: 0 % (ref 0–1)
Basophils Relative: 1 % (ref 0–1)
Eosinophils Absolute: 0.3 10*3/uL (ref 0.0–0.7)
Eosinophils Absolute: 0.3 10*3/uL (ref 0.0–0.7)
Eosinophils Absolute: 0.5 10*3/uL (ref 0.0–0.7)
Eosinophils Relative: 3 % (ref 0–5)
Eosinophils Relative: 3 % (ref 0–5)
Eosinophils Relative: 3 % (ref 0–5)
Eosinophils Relative: 5 % (ref 0–5)
Lymphocytes Relative: 13 % (ref 12–46)
Lymphocytes Relative: 18 % (ref 12–46)
Lymphocytes Relative: 22 % (ref 12–46)
Lymphs Abs: 1.2 10*3/uL (ref 0.7–4.0)
Lymphs Abs: 1.6 10*3/uL (ref 0.7–4.0)
Lymphs Abs: 1.7 10*3/uL (ref 0.7–4.0)
Lymphs Abs: 1.8 10*3/uL (ref 0.7–4.0)
Lymphs Abs: 1.9 10*3/uL (ref 0.7–4.0)
Monocytes Absolute: 0.7 10*3/uL (ref 0.1–1.0)
Monocytes Absolute: 1 10*3/uL (ref 0.1–1.0)
Monocytes Relative: 6 % (ref 3–12)
Neutro Abs: 4.7 10*3/uL (ref 1.7–7.7)
Neutro Abs: 5.1 10*3/uL (ref 1.7–7.7)
Neutro Abs: 6.3 10*3/uL (ref 1.7–7.7)
Neutro Abs: 8.4 10*3/uL — ABNORMAL HIGH (ref 1.7–7.7)
Neutrophils Relative %: 58 % (ref 43–77)
Neutrophils Relative %: 59 % (ref 43–77)
Neutrophils Relative %: 74 % (ref 43–77)
Neutrophils Relative %: 79 % — ABNORMAL HIGH (ref 43–77)

## 2010-12-10 LAB — GLUCOSE, CAPILLARY
Glucose-Capillary: 102 mg/dL — ABNORMAL HIGH (ref 70–99)
Glucose-Capillary: 104 mg/dL — ABNORMAL HIGH (ref 70–99)
Glucose-Capillary: 79 mg/dL (ref 70–99)
Glucose-Capillary: 83 mg/dL (ref 70–99)
Glucose-Capillary: 84 mg/dL (ref 70–99)
Glucose-Capillary: 85 mg/dL (ref 70–99)
Glucose-Capillary: 86 mg/dL (ref 70–99)
Glucose-Capillary: 86 mg/dL (ref 70–99)
Glucose-Capillary: 88 mg/dL (ref 70–99)
Glucose-Capillary: 90 mg/dL (ref 70–99)
Glucose-Capillary: 95 mg/dL (ref 70–99)
Glucose-Capillary: 100 mg/dL — ABNORMAL HIGH (ref 70–99)
Glucose-Capillary: 108 mg/dL — ABNORMAL HIGH (ref 70–99)
Glucose-Capillary: 111 mg/dL — ABNORMAL HIGH (ref 70–99)
Glucose-Capillary: 112 mg/dL — ABNORMAL HIGH (ref 70–99)
Glucose-Capillary: 78 mg/dL (ref 70–99)
Glucose-Capillary: 78 mg/dL (ref 70–99)
Glucose-Capillary: 79 mg/dL (ref 70–99)
Glucose-Capillary: 80 mg/dL (ref 70–99)
Glucose-Capillary: 85 mg/dL (ref 70–99)
Glucose-Capillary: 96 mg/dL (ref 70–99)
Glucose-Capillary: 96 mg/dL (ref 70–99)
Glucose-Capillary: 96 mg/dL (ref 70–99)
Glucose-Capillary: 99 mg/dL (ref 70–99)

## 2010-12-10 LAB — URINE CULTURE
Colony Count: NO GROWTH
Culture: NO GROWTH

## 2010-12-10 LAB — CBC
HCT: 25.3 % — ABNORMAL LOW (ref 39.0–52.0)
HCT: 26.5 % — ABNORMAL LOW (ref 39.0–52.0)
HCT: 27.3 % — ABNORMAL LOW (ref 39.0–52.0)
HCT: 27.6 % — ABNORMAL LOW (ref 39.0–52.0)
HCT: 29.8 % — ABNORMAL LOW (ref 39.0–52.0)
Hemoglobin: 8.3 g/dL — ABNORMAL LOW (ref 13.0–17.0)
Hemoglobin: 8.6 g/dL — ABNORMAL LOW (ref 13.0–17.0)
Hemoglobin: 8.9 g/dL — ABNORMAL LOW (ref 13.0–17.0)
Hemoglobin: 9.5 g/dL — ABNORMAL LOW (ref 13.0–17.0)
Hemoglobin: 10.4 g/dL — ABNORMAL LOW (ref 13.0–17.0)
Hemoglobin: 9.6 g/dL — ABNORMAL LOW (ref 13.0–17.0)
MCHC: 33.7 g/dL (ref 30.0–36.0)
MCHC: 33.8 g/dL (ref 30.0–36.0)
MCHC: 34.3 g/dL (ref 30.0–36.0)
MCHC: 34.5 g/dL (ref 30.0–36.0)
MCHC: 34.8 g/dL (ref 30.0–36.0)
MCHC: 34.3 g/dL (ref 30.0–36.0)
MCHC: 34.8 g/dL (ref 30.0–36.0)
MCHC: 35.1 g/dL (ref 30.0–36.0)
MCHC: 35.2 g/dL (ref 30.0–36.0)
MCV: 85.3 fL (ref 78.0–100.0)
MCV: 85.6 fL (ref 78.0–100.0)
MCV: 85.8 fL (ref 78.0–100.0)
MCV: 86.3 fL (ref 78.0–100.0)
MCV: 89.3 fL (ref 78.0–100.0)
MCV: 89.9 fL (ref 78.0–100.0)
MCV: 90 fL (ref 78.0–100.0)
MCV: 90.4 fL (ref 78.0–100.0)
MCV: 90.6 fL (ref 78.0–100.0)
Platelets: 377 10*3/uL (ref 150–400)
Platelets: 405 10*3/uL — ABNORMAL HIGH (ref 150–400)
Platelets: 423 10*3/uL — ABNORMAL HIGH (ref 150–400)
Platelets: 426 10*3/uL — ABNORMAL HIGH (ref 150–400)
Platelets: 452 10*3/uL — ABNORMAL HIGH (ref 150–400)
Platelets: 103 10*3/uL — ABNORMAL LOW (ref 150–400)
Platelets: 110 10*3/uL — ABNORMAL LOW (ref 150–400)
Platelets: 110 10*3/uL — ABNORMAL LOW (ref 150–400)
Platelets: 122 10*3/uL — ABNORMAL LOW (ref 150–400)
RBC: 3.09 MIL/uL — ABNORMAL LOW (ref 4.22–5.81)
RBC: 3.2 MIL/uL — ABNORMAL LOW (ref 4.22–5.81)
RBC: 3.05 MIL/uL — ABNORMAL LOW (ref 4.22–5.81)
RBC: 3.58 MIL/uL — ABNORMAL LOW (ref 4.22–5.81)
RDW: 14.1 % (ref 11.5–15.5)
RDW: 14.5 % (ref 11.5–15.5)
RDW: 13.7 % (ref 11.5–15.5)
RDW: 13.7 % (ref 11.5–15.5)
RDW: 14 % (ref 11.5–15.5)
WBC: 11 10*3/uL — ABNORMAL HIGH (ref 4.0–10.5)
WBC: 11.3 10*3/uL — ABNORMAL HIGH (ref 4.0–10.5)
WBC: 8.6 10*3/uL (ref 4.0–10.5)
WBC: 9 10*3/uL (ref 4.0–10.5)
WBC: 10.1 10*3/uL (ref 4.0–10.5)
WBC: 11.1 10*3/uL — ABNORMAL HIGH (ref 4.0–10.5)
WBC: 12.6 10*3/uL — ABNORMAL HIGH (ref 4.0–10.5)
WBC: 12.8 10*3/uL — ABNORMAL HIGH (ref 4.0–10.5)

## 2010-12-10 LAB — BASIC METABOLIC PANEL
BUN: 4 mg/dL — ABNORMAL LOW (ref 6–23)
BUN: 5 mg/dL — ABNORMAL LOW (ref 6–23)
BUN: 13 mg/dL (ref 6–23)
BUN: 13 mg/dL (ref 6–23)
BUN: 6 mg/dL (ref 6–23)
BUN: 7 mg/dL (ref 6–23)
CO2: 28 mEq/L (ref 19–32)
CO2: 29 mEq/L (ref 19–32)
CO2: 29 mEq/L (ref 19–32)
Calcium: 7.9 mg/dL — ABNORMAL LOW (ref 8.4–10.5)
Calcium: 6.9 mg/dL — ABNORMAL LOW (ref 8.4–10.5)
Calcium: 7.4 mg/dL — ABNORMAL LOW (ref 8.4–10.5)
Chloride: 101 mEq/L (ref 96–112)
Chloride: 102 mEq/L (ref 96–112)
Chloride: 103 mEq/L (ref 96–112)
Chloride: 107 mEq/L (ref 96–112)
Chloride: 110 mEq/L (ref 96–112)
Chloride: 112 mEq/L (ref 96–112)
Creatinine, Ser: 0.82 mg/dL (ref 0.4–1.5)
Creatinine, Ser: 0.84 mg/dL (ref 0.4–1.5)
Creatinine, Ser: 0.87 mg/dL (ref 0.4–1.5)
Creatinine, Ser: 0.91 mg/dL (ref 0.4–1.5)
Creatinine, Ser: 0.95 mg/dL (ref 0.4–1.5)
Creatinine, Ser: 0.97 mg/dL (ref 0.4–1.5)
Creatinine, Ser: 1.24 mg/dL (ref 0.4–1.5)
Creatinine, Ser: 1.32 mg/dL (ref 0.4–1.5)
GFR calc Af Amer: >60 mL/min (ref >60)
GFR calc Af Amer: >60 mL/min (ref >60)
GFR calc Af Amer: >60 mL/min (ref >60)
GFR calc Af Amer: >60 mL/min (ref >60)
GFR calc Af Amer: >60 mL/min (ref >60)
GFR calc non Af Amer: >60 mL/min (ref >60)
GFR calc non Af Amer: >60 mL/min (ref >60)
GFR calc non Af Amer: >60 mL/min (ref >60)
GFR calc non Af Amer: >60 mL/min (ref >60)
Glucose, Bld: 100 mg/dL — ABNORMAL HIGH (ref 70–99)
Glucose, Bld: 81 mg/dL (ref 70–99)
Glucose, Bld: 98 mg/dL (ref 70–99)
Potassium: 3.8 mEq/L (ref 3.5–5.1)
Potassium: 4.1 mEq/L (ref 3.5–5.1)
Potassium: 3.5 mEq/L (ref 3.5–5.1)
Potassium: 3.7 mEq/L (ref 3.5–5.1)
Potassium: 4 mEq/L (ref 3.5–5.1)
Sodium: 134 mEq/L — ABNORMAL LOW (ref 135–145)
Sodium: 134 mEq/L — ABNORMAL LOW (ref 135–145)
Sodium: 139 mEq/L (ref 135–145)

## 2010-12-10 LAB — WOUND CULTURE

## 2010-12-10 LAB — GRAM STAIN

## 2010-12-10 LAB — EXPECTORATED SPUTUM ASSESSMENT W GRAM STAIN, RFLX TO RESP C

## 2010-12-10 LAB — CULTURE, BLOOD (ROUTINE X 2)
Culture: NO GROWTH
Culture: NO GROWTH

## 2010-12-10 LAB — POCT I-STAT EG7
Acid-Base Excess: 2 mmol/L (ref 0.0–2.0)
Calcium, Ion: 1.13 mmol/L (ref 1.12–1.32)
Calcium, Ion: 1.18 mmol/L (ref 1.12–1.32)
HCT: 21 % — ABNORMAL LOW (ref 39.0–52.0)
Hemoglobin: 7.1 g/dL — CL (ref 13.0–17.0)
O2 Saturation: 100 %
Potassium: 3.8 mEq/L (ref 3.5–5.1)
Potassium: 4.2 mEq/L (ref 3.5–5.1)
Sodium: 134 mEq/L — ABNORMAL LOW (ref 135–145)
TCO2: 26 mmol/L (ref 0–100)
pH, Ven: 7.416 — ABNORMAL HIGH (ref 7.250–7.300)
pO2, Ven: 294 mmHg — ABNORMAL HIGH (ref 30.0–45.0)

## 2010-12-10 LAB — ANAEROBIC CULTURE

## 2010-12-10 LAB — CROSSMATCH: ABO/RH(D): O POS

## 2010-12-10 LAB — POCT I-STAT 3, ART BLOOD GAS (G3+)
O2 Saturation: 100 %
pCO2 arterial: 37.8 mmHg (ref 35.0–45.0)
pH, Arterial: 7.397 (ref 7.350–7.450)

## 2010-12-10 LAB — MAGNESIUM: Magnesium: 1.6 mg/dL (ref 1.5–2.5)

## 2010-12-10 LAB — AFB CULTURE WITH SMEAR (NOT AT ARMC)
Acid Fast Smear: NONE SEEN
Acid Fast Smear: NONE SEEN

## 2010-12-10 LAB — HEMOGLOBIN AND HEMATOCRIT, BLOOD: HCT: 26.6 % — ABNORMAL LOW (ref 39.0–52.0)

## 2010-12-10 LAB — BODY FLUID CULTURE: Culture: NO GROWTH

## 2010-12-10 LAB — GLUCOSE, SEROUS FLUID

## 2010-12-10 LAB — SEDIMENTATION RATE: Sed Rate: 138 mm/hr — ABNORMAL HIGH (ref 0–16)

## 2010-12-10 LAB — POCT I-STAT 4, (NA,K, GLUC, HGB,HCT)
Hemoglobin: 10.9 g/dL — ABNORMAL LOW (ref 13.0–17.0)
Potassium: 3.9 mEq/L (ref 3.5–5.1)
Sodium: 137 mEq/L (ref 135–145)

## 2010-12-10 LAB — CALCIUM, IONIZED: Calcium, Ion: 1.18 mmol/L (ref 1.12–1.32)

## 2010-12-10 LAB — BODY FLUID CELL COUNT WITH DIFFERENTIAL
Neutrophil Count, Fluid: 97 % — ABNORMAL HIGH (ref 0–25)
Total Nucleated Cell Count, Fluid: 11900 cu mm — ABNORMAL HIGH (ref 0–1000)

## 2010-12-10 LAB — CULTURE, RESPIRATORY W GRAM STAIN: Culture: NORMAL

## 2010-12-10 LAB — PROTIME-INR
INR: 1.7 — ABNORMAL HIGH (ref 0.00–1.49)
Prothrombin Time: 21 seconds — ABNORMAL HIGH (ref 11.6–15.2)

## 2010-12-10 LAB — POCT I-STAT GLUCOSE: Glucose, Bld: 89 mg/dL (ref 70–99)

## 2010-12-10 LAB — LACTATE DEHYDROGENASE, PLEURAL OR PERITONEAL FLUID

## 2010-12-10 LAB — PREPARE RBC (CROSSMATCH)

## 2010-12-10 LAB — PHOSPHORUS: Phosphorus: 3 mg/dL (ref 2.3–4.6)

## 2010-12-10 LAB — PH, BODY FLUID

## 2010-12-11 LAB — URINALYSIS, ROUTINE W REFLEX MICROSCOPIC
Glucose, UA: NEGATIVE mg/dL
Leukocytes, UA: NEGATIVE
Nitrite: NEGATIVE
Protein, ur: NEGATIVE mg/dL
Urobilinogen, UA: 0.2 mg/dL (ref 0.0–1.0)

## 2010-12-11 LAB — PREPARE FRESH FROZEN PLASMA

## 2010-12-11 LAB — POCT I-STAT 4, (NA,K, GLUC, HGB,HCT)
Glucose, Bld: 87 mg/dL (ref 70–99)
HCT: 38 % — ABNORMAL LOW (ref 39.0–52.0)
Potassium: 4.5 mEq/L (ref 3.5–5.1)
Sodium: 144 mEq/L (ref 135–145)

## 2010-12-11 LAB — TYPE AND SCREEN

## 2010-12-11 LAB — COMPREHENSIVE METABOLIC PANEL
ALT: 18 U/L (ref 0–53)
Calcium: 8.9 mg/dL (ref 8.4–10.5)
GFR calc Af Amer: >60 mL/min (ref >60)
Glucose, Bld: 100 mg/dL — ABNORMAL HIGH (ref 70–99)
Sodium: 136 mEq/L (ref 135–145)
Total Protein: 7.2 g/dL (ref 6.0–8.3)

## 2010-12-11 LAB — POCT I-STAT GLUCOSE
Glucose, Bld: 93 mg/dL (ref 70–99)
Operator id: 140821

## 2010-12-11 LAB — POCT I-STAT 3, ART BLOOD GAS (G3+)
Acid-base deficit: 2 mmol/L (ref 0.0–2.0)
O2 Saturation: 100 %
pO2, Arterial: 189 mmHg — ABNORMAL HIGH (ref 80.0–100.0)

## 2010-12-11 LAB — VITAMIN D 25 HYDROXY (VIT D DEFICIENCY, FRACTURES): Vit D, 25-Hydroxy: 30 ng/mL (ref 30–89)

## 2010-12-11 LAB — BASIC METABOLIC PANEL
BUN: 9 mg/dL (ref 6–23)
CO2: 24 mEq/L (ref 19–32)
Chloride: 111 mEq/L (ref 96–112)
GFR calc non Af Amer: >60 mL/min (ref >60)
Glucose, Bld: 131 mg/dL — ABNORMAL HIGH (ref 70–99)
Potassium: 4 mEq/L (ref 3.5–5.1)
Sodium: 138 mEq/L (ref 135–145)

## 2010-12-11 LAB — DIFFERENTIAL
Eosinophils Absolute: 0.1 10*3/uL (ref 0.0–0.7)
Lymphs Abs: 2.2 10*3/uL (ref 0.7–4.0)
Monocytes Relative: 7 % (ref 3–12)
Neutrophils Relative %: 48 % (ref 43–77)

## 2010-12-11 LAB — URINE MICROSCOPIC-ADD ON

## 2010-12-11 LAB — CBC
HCT: 34.5 % — ABNORMAL LOW (ref 39.0–52.0)
Hemoglobin: 11.7 g/dL — ABNORMAL LOW (ref 13.0–17.0)
Hemoglobin: 14.2 g/dL (ref 13.0–17.0)
MCHC: 33.8 g/dL (ref 30.0–36.0)
MCHC: 34.3 g/dL (ref 30.0–36.0)
MCV: 90.6 fL (ref 78.0–100.0)
Platelets: 126 10*3/uL — ABNORMAL LOW (ref 150–400)
RDW: 14 % (ref 11.5–15.5)
RDW: 14.2 % (ref 11.5–15.5)

## 2010-12-11 LAB — PROTIME-INR: INR: 1.1 (ref 0.00–1.49)

## 2010-12-11 LAB — GLUCOSE, CAPILLARY: Glucose-Capillary: 89 mg/dL (ref 70–99)

## 2010-12-11 LAB — APTT: aPTT: 40 seconds — ABNORMAL HIGH (ref 24–37)

## 2011-01-13 NOTE — Op Note (Signed)
NAMETERYN, GUST NO.:  [id number]   MEDICAL RECORD NO.:  [id number]           PATIENT TYPE:   LOCATION:                                 FACILITY:   PHYSICIAN:  Nelda Severe, MD      DATE OF BIRTH:  08-16-1961   DATE OF PROCEDURE:  12/24/2008  DATE OF DISCHARGE:                               OPERATIVE REPORT   SURGEON:  Nelda Severe, MD   ASSISTANT:  OR staff.   PREOPERATIVE DIAGNOSIS:  Deep wound infection/abscess status post T10-S1  fusion.   POSTOPERATIVE DIAGNOSIS:  Deep wound infection/abscess status post T10-  S1 fusion.   OPERATIVE PROCEDURE:  Incision, drainage, debridement thoracolumbar  wound abscess, insertion of antibiotic loaded beads (vancomycin and  tobramycin with OsteoSet), insertion deep subfascial drains (Blake  drains x2 15-gauge), and closure of thoracolumbar wound.   OPERATIVE FINDINGS:  The skin and subcutaneous tissue and fascia were  all extremely well healed.  There was no fluid in the subcutaneous layer  whatsoever.  The fascial layer was a well healed.  Upon opening the  fascial layer and getting into the subfascial deep plane, we  encountered, under pressure, a large quantity of purulent-looking fluid  with fibrinous exudate, etc.  This was cultured x2 for aerobic and  anaerobic bacteria.  There have been previous cultures taken via wound  aspiration yesterday, prior to initiating antibiotics.   OPERATIVE PROCEDURE:  The patient was placed under general endotracheal  anesthesia.  A Foley catheter was placed in the bladder.  Sequential  compression devices were placed on both lower extremities.  He had  already received antibiotics per orders from the Infectious Disease  Service.   He was positioned prone on a Jackson frame.  Care was taken to position  the upper extremities so as to avoid hyperflexion and abduction of the  shoulders and so as to avoid hyperflexion of the elbows.  The thighs,  knees, shins, ankles were  padded with pillows.   The thoracolumbar area was scrubbed with Betadine scrub and prepped with  Betadine solution.  Drapes were applied in rectangular fashion and  secured with Ioban.   The thoracolumbar incision was excised in elliptical fashion leaving a  margin of approximately 3 or 4 mm on either side of the incision.  The  incision were taken deep into the rather thick subcutaneous layer.  Cutting current was then used to incise the subcutaneous layer deeper to  this fascial layer.  The fascia was incised vertically.  It was well  healed.  Ultimately, I was able to enter the subfascial plane at the  lower thoracic level, and at this point a large quantity of purulent-  looking fluid literally gushed into the wound under pressure.  This was  suctioned away.  The midline incision was completed to approximately S2  level and retractor was placed.  Prior to doing any further debridement,  I irrigated 3 L of saline via pulsatile lavage to get rid of the bulk of  the fibrin and posterior.  We then debrided necrotic-looking tissue and  fibrinous exudate from  around the rods and screws, including  posterolaterally, distally, and around the ileal connector.  We then  irrigated the wound with a total of 6 more liters under pulsatile  lavage.  The muscle appeared all very viable, and there really was not  any significant muscle to debris.   We had by this time incubated 1 g of vancomycin, 1.2 g of Tobramycin  with OsteoSet, and placed the mixture in a bead making mold.  The beads  had formed sufficiently that we were then able to place them in the  subfascial layer of the wound.   I then closed the wound prior to placing two 15-gauge Blake drains in  the subfascial layer from the right side.  Each of these drains were  secured with a 2-0 Vicryl suture to the skin.  The muscle was opposed  using interrupted #1 Vicryl sutures and the thoracolumbar fascia closed  using interrupted  figure-of-eight #1 Vicryl sutures.  Subcutaneous layer  was then closed using 0 Vicryl sutures in interrupted fashion and the  skin closed using simple and vertical mattress sutures of 2-0 nylon.  An  antibiotic ointment gauze dressing was then applied and reinforced with  ABD pads and OpSite.   The blood loss estimated at approximately 500 mL.  The patient did  receive 2 units of packed red blood cells during the procedure.  He was  stable throughout the procedure.  There were no intraoperative  complications.  At the time of dictation, he has arrived in the recovery  room and is awake and can move his lower extremities without difficulty.  There was actually no exposure of dural nerve roots performed during  this procedure.   Sponge and needle counts were correct.      Nelda Severe, MD  Electronically Signed     MT/MEDQ  D:  12/24/2008  T:  12/25/2008  Job:  [id number]

## 2011-01-13 NOTE — Consult Note (Signed)
Christopher [name], Christopher [name]              ACCOUNT NO.:  [id number]   MEDICAL RECORD NO.:  [id number]          PATIENT TYPE:  EMS   LOCATION:  [facility]                         FACILITY:  [facility]   PHYSICIAN:  Nelda Severe, MD      DATE OF BIRTH:  1961/04/15   DATE OF CONSULTATION:  02/06/2009  DATE OF DISCHARGE:  02/06/2009                                 CONSULTATION   This gentleman was asked by me to come to the emergency room today for  evaluation of increasing back pain approximately 10 weeks status post  thoracolumbar fusion and about 8 weeks status post incision, drainage,  and debridement lumbar wound for deep wound infection.  Unfortunately,  we were never able to get any positive cultures for the wound infection,  it is almost certain that the wound was infected.  He has done well and  had been on intravenous antibiotics until approximately 2 weeks ago when  he was switched to Bactrim DS, 1 b.i.d..  He has been concurrently cared  for by Dr. Ninetta Lights, Infectious Disease consultant.   A few days ago, he developed left-sided low back pain which he described  as being in the moderate range.  However, he had literally been pain  free the last time he was seen in the office about 2 weeks ago.  Therefore, I asked that he come to the emergency room so that we could  investigate him at this time on an expedited basis.  On examination  today, the wound was not tender.  There was no redness.  There was no  swelling.  There was no sign of any drainage.  A CBC, differential,  erythrocyte sedimentation rate, and C-reactive protein were ordered.  The white blood count was normal and the sed rate was elevated at 54.  C-  reactive proteins not available at this time.  Plain radiographs of  lumbosacral spine were consistent with his postop status following T10-1  fusion without evidence of any untoward.   However, the CT scan shows loosening of the left sacral screw and  resorption of bone around the  interbody cage at L5-S1.  There is no  obvious abscess formation.  There does appear to be a rather good early  response to bone healing in terms of the fusion.   DIAGNOSIS:  1. Status post thoracolumbar fusion complicated by deep wound      infection, status post incision, drainage, and debridement (x2) and      antibiotic therapy first intravenous, then oral.  2. Likely recurrence of deep wound infection.   DISCUSSION:  I think this man has residual infection.  I think that the  loosening at this stage of his interbody cage at L5-S1 with bone  resorption all rounded in the end plates above and below, is highly  suggestive of there being an infection.  His sed rate of 54 is rather  high for being at this point in the postoperative course.  He never did  have a high white blood cell count even when his infection was much more  acute and he was having  chills and fever, prior to the reoperation.   At the present time, he needs to not come into the hospital because his  daughter is graduating from college tomorrow.  He is not acutely ill.  Therefore, I did not press the issue of admitting him at this time.  However, we will need to get him in very near future.  My plan, as  outlined to him and his wife, and believe his father who is accompanying  them, was that we will would get a PICC line established, reinstitute  intravenous antibiotics, and perform an anterior L5-S1 disk excision,  debridement, removal of infected implant, and reimplant a cage from the  anterior approach, possibly SynFix.  We will then at some point,  probably 2-3 weeks later, address the loose hardware posteriorly, which  appears to be at this point at least a loose S1 screw on the left side.  If the contralateral screw is loose, we would need to replace that as  well.  I suspect his loose implants are in fact part of the cause of his  pain.   He will report to the office on June 14 and we will get him admitted  to  the hospital at that time for further treatment of his subacute lumbar  spine infection.      Nelda Severe, MD  Electronically Signed     MT/MEDQ  D:  02/06/2009  T:  02/07/2009  Job:  [id number]

## 2011-01-13 NOTE — H&P (Signed)
Christopher [name], Christopher [name]              ACCOUNT NO.:  [id number]   MEDICAL RECORD NO.:  [id number]          PATIENT TYPE:  INP   LOCATION:  5032                         FACILITY:  [facility]   PHYSICIAN:  Nelda Severe, MD      DATE OF BIRTH:  03-14-1961   DATE OF ADMISSION:  12/22/2008  DATE OF DISCHARGE:                              HISTORY & PHYSICAL   CHIEF COMPLAINT:  Recurrent fevers, status post thoracolumbar fusion.   HISTORY OF PRESENT ILLNESS:  This man underwent revision laminectomy in  the lumbar spine and thoracolumbar fusion, T10-S1, 24 days ago.  He had  an uneventful postoperative course.  He was discharged home to the  [place], [place] area.  Beginning, about a week postop, he  developed episodic fevers.  These have been daily recently.  Last night,  he was evaluated at [facility] in [place], [place].  When  he presented, he was afebrile, but at the time of discharge, he had  developed a fever on excessive 101 degrees Fahrenheit as communicated to  me by the emergency room doctor via the phone, this morning some time  after midnight.  At that visit, he did not have a leukocytosis, although  I believe on a previous visit to the same hospital, he had manifest a  leukocytosis.   His symptoms are primarily of feeling febrile and having chills.  He is  not, between episodes, fell ill.  He has not had increased pain or  swelling or drainage in his surgical wound.   He is allergic to and/or has had adverse reactions to the following  medications:  SKELAXIN, MOBIC, VICODIN, LYRICA, CODEINE, IBUPROFEN,  MORPHINE, DILAUDID, OXYCODONE, HYDROCODONE, ASPIRIN, FLEXERIL,  BUTALBITAL, and METHADONE.   Currently, he takes Demerol 50 mg plus 25 mg of Phenergan for pain on an  as-needed basis.  He also takes lisinopril 5 mg daily for hypertension.  He takes metformin 500 mg b.i.d. for type 2 diabetes.   PHYSICAL EXAMINATION:  GENERAL:  Today, he is fairly comfortable,  lying  on a gurney.  I did not get him upright to examine him.  SKIN:  Very warm to the touch, particularly over the trunk of his body,  chest, abdomen, etc.  NEUROLOGIC:  He is completely aware and oriented and lucid.  NECK:  Supple without masses.  CHEST:  Clear to auscultation and percussion.  HEART:  Sounds are normal.  Rate is 108 per minute.  The rhythm is  regular.  I could hear no murmurs.  ABDOMEN:  Soft and nontender.  Bowel sounds are active.  EXTREMITIES:  He does not have swelling of his lower extremities.  He  has normal strength in upper and lower extremities.   DIAGNOSIS:  Status post thoracolumbar fusion, possible deep wound  infection.   DISCUSSION:  I think the most important thing at this point is to  investigate the possibility that his recurrent febrile episodes may be  secondary to a deep wound infection.   I have discussed his case with Dr. Margo Aye of Radiology.  We are going to  get an  MRI scan.  He will doubtless have a deep wound hematoma at this  point.  By means of the MRI, we will be able to see what is most likely  amenable to aspiration.  He then needs to have an aspiration of fluid  under image guidance.  I have explained to Dr. Margo Aye that he has  laminectomy and so his dura is unprotected by intact lamina.   Also, I am going to get an Infectious Disease consultation on him.  Obviously, we were not starting antibiotics until we at least have a  wound hematoma specimen to be sent for culture.      Nelda Severe, MD  Electronically Signed     MT/MEDQ  D:  12/22/2008  T:  12/22/2008  Job:  [id number]

## 2011-01-13 NOTE — Op Note (Signed)
Christopher [name], Christopher [name]              ACCOUNT NO.:  [id number]   MEDICAL RECORD NO.:  [id number]          PATIENT TYPE:  INP   LOCATION:  5032                         FACILITY:  [facility]   PHYSICIAN:  Nelda Severe, MD      DATE OF BIRTH:  1961/08/31   DATE OF PROCEDURE:  12/26/2008  DATE OF DISCHARGE:                               OPERATIVE REPORT   SURGEON:  Nelda Severe, MD   ASSISTANT:  Lianne Cure, PA-C   PREOPERATIVE DIAGNOSIS:  Deep wound infection, thoracolumbar spine,  status post incision and drainage and debridement and insertion of beads  48 hours ago.   POSTOPERATIVE DIAGNOSIS:  Deep wound infection, thoracolumbar spine,  status post incision and drainage and debridement and insertion of beads  48 hours ago.   PROCEDURE:  Re-exploration of thoracolumbar wound, repeat debridement,  irrigation with pulsatile lavage, insertion of antibiotic-impregnated  OsteoSet beads (tobramycin and vancomycin), insertion of deep and  superficial drains, primary closure.   CLINICAL NOTE:  This patient was brought to the operating room  approximately 48 hours ago for incision and drainage, debridement,  lavage, insertion of antibiotic beads, and drains for a deep wound  infection of thoracolumbar spine.  We are returning him today after 2  days for a reinspection and re-debridement of any additional necrotic  tissue.  He has screws and rods retained at the present time, from T10  through S1 as well as 1 ilial screw and ilial rod.   OPERATIVE NOTE:  The patient was placed under general endotracheal  anesthesia.  He was already receiving antibiotics as per Infectious  Disease service orders.  He was positioned prone on the [place] frame.  The upper extremities were positioned so as to avoid hyperflexion and  abduction of the shoulders and so as to avoid hyperflexion of the  elbows.  Upper extremities were padded with foam from axilla to hands.  The thighs, knees, shins, and ankles were  supported with pillows.  Foley  catheter had been placed in the bladder.   The thoracolumbar area was prepped with Betadine, scrub and solution,  and draped in rectangular fashion.  The drapes were secured with Ioban  strips peripherally.  Prior to prepping and draping, the interrupted  nylon sutures in the skin had all been removed.   At this point, a time-out was held at which point the patient's identity  was confirmed as well as the preoperative diagnosis, intended procedure,  anticipated blood loss, anticipated length of surgery, and the other  parameters that are usually covered.   We began by spreading the superficial layer, which had only been closed  48 hours before.  Re-incision through the skin incision was not  necessary.  We then removed multitudinous interrupted sutures from the  subcutaneous layer, then the fascial layer, and then muscle to open up  the wound.  There was serosanguineous drainage within the wound.  There  was nothing that looked purulent.  There was some blood clot in lateral  aspects of the wound.  There was some fibrinous exudate on the muscle.  The previously placed beads could  be easily identified.   We began by irrigating the wound out with pulsatile lavage, 3 liters.  We then performed further meticulous debridement of subcutaneous,  fascial, and muscle layer.  Clot was mechanically removed from around  the rods and screws.  We then irrigated another 3000 mL via pulsatile  lavage.  Further debridement and hemostasis was carried out.   In the meantime, we had mixed OsteoSet with 1.2 g of tobramycin and 1 g  of vancomycin.  While the beads finished setting, antibiotic solution  was placed in the wound and allowed to stand for about 10 minutes.  This  was then sucked out.   Antibiotic beads were then placed in the subfascial layer of the wound  from proximal to distal.  We had previously, during the debridement  process, removed all identifiable  previously placed beads.  We then  placed two 15-gauge Blake drain subfascially and brought through exit  portals to the right side proximally.  These drains were secured with 2-  0 nylon suture in basket weave fashion.   I then placed approximately 6-8 interrupted figure-of-eight #1 Vicryl  sutures through this muscle layer, subfascially.  These were all placed  and then tied.  Great care was taken to make sure that none of the  stitches involved snaring of either of the deep drains.   We then opposed the thoracolumbar fascia using numerous figure-of-eight  #1 Vicryl sutures in interrupted fashion.  An 8-inch Hemovac drain was  then placed in the subcutaneous layer and brought out through the skin  to the right side.  The subcutaneous layer was then closed using  numerous interrupted undyed Vicryl sutures of 0 Vicryl.  The skin was  then closed using 2-0 nylon suture in interrupted fashion, most of the  stitches being horizontal and/or vertical mattress sutures.   We then applied an antibiotic ointment dressing and secured with OpSite.   The blood loss estimated about 500 mL.  The patient was stable  throughout the procedure.  There were no intraoperative complications.  The duration of the procedure was approximately 2 hours and 40 minutes  of operating time.   Sponge and needle counts were correct.      Nelda Severe, MD  Electronically Signed     MT/MEDQ  D:  12/26/2008  T:  12/27/2008  Job:  [id number]

## 2011-01-13 NOTE — Op Note (Signed)
Christopher [name] NO.:  [id number]   MEDICAL RECORD NO.:  [id number]          PATIENT TYPE:  INP   LOCATION:  2306                         FACILITY:  [facility]   PHYSICIAN:  Nelda Severe, MD      DATE OF BIRTH:  1961/07/24   DATE OF PROCEDURE:  11/28/2008  DATE OF DISCHARGE:                               OPERATIVE REPORT   SURGEON:  Nelda Severe, MD   ASSISTANT:  Lianne Cure, PA-C   PREOPERATIVE DIAGNOSES:  Multilevel lumbar spondylosis/stenosis, status  post L2 through L5 bilateral laminectomy.   POSTOPERATIVE DIAGNOSES:  Multilevel lumbar spondylosis/stenosis, status  post L2 through L5 bilateral laminectomy.   OPERATIVE PROCEDURES:  1. Revision of laminectomies bilaterally at L2-3, L3-4, and L4-5.  2. Posterior interbody fusion (left PLIF) at L5-S1 with Titan      interbody cage and graft.  3. Posterior fusion T10-S1.  4. Bilateral pedicle screw and rod fixation (except left L3) from T10      to S1.  5. Right iliolumbar fixation.  6. Bone marrow aspiration, L3 pedicle, right side.  7. Admixture of bone marrow aspirate InQu (hyaluronic acid) and local      graft, admixture of local graft and Infuse (bone morphogenetic      protein).   OPERATIVE NOTE:  The patient was placed under general endotracheal  anesthesia.  Vancomycin was infused intravenously as prophylaxis.  Sequential compression devices were placed on both lower extremities.  A  Foley catheter was placed in the bladder.  The patient was positioned  prone on a Jackson frame.  Care was taken to position the upper  extremities so as to avoid hyperflexion and abduction of the shoulders.  The upper extremities were padded with foam from axilla to hands.  The  thighs, knees, shins, ankles, and feet were padded and supported on  pillows.   Hair was clipped from the thoracolumbar area.  The previous midline scar  was outlined using a skin marker and the proposed incision extended  proximally and distally with the marker.  The lumbar area and  thoracolumbar area were prepped with DuraPrep.  Drapes were applied in  rectangular fashion and secured with Ioban.  A time-out was held in  which the patient's identity was confirmed, preoperative diagnosis was  confirmed, proposed surgery was confirmed, allergies were confirmed and  the fact he was a diabetic, it was confirmed.   An incision was made into the dermis in elliptical fashion around the  previous scar and extended proximally and distally.  The subcutaneous  tissue was injected with a mixture of 0.25% plain Marcaine and 1%  lidocaine with epinephrine.  Cutting current was used to excise the scar  and to carry the incision down to the spinous processes from  approximately T9-10 level to S1.  The subcutaneous layer was quite thick  and is also extremely muscular, the extent at the wound at the L5 level  was 5 inches deep from the surface of the skin to the periphery of the  L5-S1 apophyseal joint.  Paraspinal muscles and scar were mobilized  bilaterally and the  tips of the transverse processes of L1 through L5  and ala of the sacrum exposed initially on the left side.  The  transverse processes of the T10, T11, and T12 were also exposed.   I performed a revision laminectomy on the left at L4-5, L3-4, and L2-3.  At L3-4 and L4-5, this involved complete facet joint resection and  partial facet joint resection at L2-3.  At the L3 level, a small dural  rent occurred when I was trying to separate dura from overlying adherent  ligamentum flavum and perforated the dura with a dissector.  Ultimately,  this dural rent was repaired with a single 6-0 Prolene and placed in  horizontal mattress fashion.   We also performed a left facetectomy at L5-S1 in preparation for the  transforaminal interbody fusion.  By this time, enough local graft had  been harvested that we were able to have fusion material for the  posterolateral  fusion on the left side from transverse process of L1 to  the ala of the sacrum.  To this, we had mixed a large pack of Infuse and  soaked collagen sponge.  The collagen sponge was morcellized and mixed  with the graft.  We then placed pedicle hole at L5 in the usual fashion,  identifying the base of the superior articular process, perforating the  posterior pedicle with an awl, making a hole through the pedicle into  the body and probing it carefully with a ball-tip probe to make sure it  was circumferentially intact, and also measuring for depth.  A 7.2-mm  screw of appropriate length was then inserted.  At this point, using a  high-speed bur, the ala of the sacrum and the transverse process at L5  were decorticated.  The bone graft mixture was picked back into this  level.  A pedicle hole was then made at L5 in the fashion described  above, and a 6.5-mm diameter screw of appropriate length inserted.  In  stepwise fashion, screws were placed up to L1, at each step placing more  bone graft between the transverse processes of the adjacent vertebra,  first decorticating with a high-speed bur.  We then placed pedicle  screws, also 6.5 mm in diameter at T12, T11, and T10.   At this point, we performed the transforaminal interbody fusion at L5-  S1.  The venous plexus in the neural foramen was bipolar coagulated.  The lateral edge of the dura and origin of the S1 nerve root were not  identified and retracted medially.  Another D'Errico retractor was used  to protect the exiting L5 nerve root above.  The annulus was  fenestrated.  Intradiskal scrapers were used to partially enucleate the  disk.  We were able to get an intradiskal distractor, 14 mm, into the  disk space.  We then attached a working rod between L5 and S1 to hold  the distraction.  Then, further vigorous curettage was carried on of  both upper and lower endplates at the L5-S1 level.   We then placed a special funnel in the disk  and packed bone graft  material anteriorly.  A 12-mm high Titan implant was then loaded with  bone graft and impacted into place.  It was well countersunk.  The set  screws were then let off the working rod and the endplates gripped the  implant.   I then switched to the right side of the table.  Revision laminectomies  were performed at L4-5, L3-4, and  L2-3.  This was very arduous.  There  was exuberant synovial reaction at all facet joints on both sides with  adherence of joint capsule to the laminectomy membrane from the previous  surgery as well as the dura.  Ultimately, complete facetectomies were  performed at L3-4 and L4-5 and partial facetectomies at L2.  We then  placed pedicle screws on the right side from S1 through T10 proximally  at every level.   At this point, all screws were stimulated electrically and distal EMG  activity monitored.  In no instance was stimulation possible at the  critical level of 8 mA or less.  In all instances, it was well above  that, and many instances, stimulation was ceased when 30 mA was achieved  and there was no distal response.   A cross-table lateral radiograph had been taken at the time that we had  finished screw placement on the left side.  Ultimately, another cross-  table lateral radiograph was taken.   Also, not noted above is the fact that we placed a big mixture of bone  graft and BMP posteriorly from L5-S1 distally through L1-2 proximally in  the posterolateral intertransverse location.  Also, not mentioned above,  is that at the interbody fusion at L5-S1, no BMP was placed in the disk  space.  InQu, local bone graft and blood was mixed and used as the graft  material at that level, so as not to expose the nerves to any BMP.   Next, we placed a right-sided iliac screw.  This was done by raising a  flap of skin and subcutaneous tissue to approach the iliac crest.  A  notch was cut in the iliac crest.  A hole was made between the  tables  using a pedicle probe and a 70 mm x 7.2 mm screw inserted.  A tunnel was  then made deep to the sacral spinalis muscle to the posterior aspect of  the sacrum.  A side connector was attached to the iliac screw for  ultimate attachment to an iliolumbar rod.   At this point, we contoured and placed a rod from S1 distally to T10  proximally on the right side.  This was provisionally coupled with set  screws at each coupling.  We chose an appropriate location for placement  of a Domino type side-to-side rod connector.  The rod was also contoured  to go between that side that Domino connector and the lateral connector  placed on the iliac rod.   We then moved to the left side and contoured another rod and  provisionally placed.  We then decorticated and harvested more bone  graft from T10-11 distally to L2.  This was mixed with more InQu  (hyaluronic acid) and blood.  Facet joint surfaces were resected at T10-  11, T11-12, and T12-L1.  The mixture of hyaluronic acid (InQu) and bone  graft was then packed in bilaterally.  At this point, the Domino  connector on the right side was attached to the iliolumbar rod and all  set screws sat provisionally.  Ultimately, we had a cross-table lateral  radiograph and had PA radiograph, which showed satisfactory position of  all the implants.  We then torqued all of the couplings.  It should be  mentioned that on the left side, the L3 screw had to be removed because  the rod would not couple with it.  It was placed slightly to medially.   Having taken the x-rays, we  did torque all couplings.  The side-to-side  connector was tightened with titanium squeak.   At this point, prior to closing, I packed Gelfoam dorsal to all of the  bone graft material to prevent it from displacing.  A muscle graft was  placed over the repair placed in the dura at L3.  Gelfoam was placed  over the dura dorsally throughout the area of laminectomy, but not  placed  within the spinal canal.   We then placed a 15-gauge Blake drain subfascially and brought out  through the skin to the right side.  It was secured with 2-0 nylon in  basket-weave fashion.  The thoracolumbar fascia was then closed using a  combination of interrupted and continuous sutures, #1 Vicryl.  The  subcutaneous layer was closed using interrupted 0 and 2-0 Vicryl over a  one-quarter inch Hemovac drain also brought out through the skin on the  right side, both drains being secured with 2-0 nylon.  The skin was then  closed using a continuous 3-0 undyed Vicryl in subcuticular fashion.  Skin edges were reinforced with Steri-Strips.  An antibiotic ointment  dressing was applied and secured with OpSite.   The blood loss estimated between 2500 and 3000 mL.  The patient received  4 units of FFP.  At this time, I do not know the actual count of units  of Cell Saver blood that he received, nor the extent to which he  received packed red blood cells of donor blood.   At the time of dictation, he has not been examined, but it was not  anticipated it would be possible to perform a neurologic exam because he  was going to be kept intubated overnight.  Critical Care Service had  been consulted.   Other than the small dural rent, there were no intraoperative  complications.   Sponge and needle counts were correct.      Nelda Severe, MD  Electronically Signed     MT/MEDQ  D:  11/28/2008  T:  11/29/2008  Job:  [id number]

## 2011-01-13 NOTE — Discharge Summary (Signed)
NAMEKRIS, NO              ACCOUNT NO.:  [id number]   MEDICAL RECORD NO.:  [id number]          PATIENT TYPE:  INP   LOCATION:  5032                         FACILITY:  [facility]   PHYSICIAN:  Nelda Severe, MD      DATE OF BIRTH:  1960-09-09   DATE OF ADMISSION:  12/22/2008  DATE OF DISCHARGE:  01/02/2009                               DISCHARGE SUMMARY   REASON FOR ADMISSION/DIAGNOSIS:  Recurrent fever status post  thoracolumbar fusion.  The patient was admitted to [facility] on December 22, 2008, under the care of Dr. Nelda Severe.  He was 24 days status  post thoracolumbar fusion, T10-S1, uneventful postoperative course until  this date.  He was admitted secondary to recurrent fevers.  We will be  ordering an MRI and getting Internal Medicine consult, which was done  the same day of admission by Dr. Cliffton Asters.  He agreed with  observation pending the MRI at that point.  MRI revealed fluid  collection.  There was a tap ordered for cultures.  White count was 8.8.  CRP 17.3.  ESR 138.  Started vancomycin, Fortaz pending cultures.  He  was taken to the OR for the culture on December 23, 2008.  His white count  on December 24, 2008, was 8.1, neutrophils 97, CRP 17.3, ESR 138,  temperature 99.9-100.1.   PLAN:  Taken to the OR on December 24, 2008.  This was done at 1800 hour by  Dr. Nelda Severe secondary to deep wound infection.  Estimated blood  loss was 500 mL.  He was then taken back to his room.  On postop day 1,  post I and D, his temperature was 101.1 to 102, hemoglobin was stable at  8.6, white count 9.0.  Cultures no growth on postop day 1.  Distally  neurovascularly motor intact.  We are continuing with the antibiotics  vancomycin and Fortaz per I and D's.  They are going to add rifampin as  well.  He was taken back to the operating room on December 26, 2008, by Dr.  Nelda Severe for a second debridement, irrigation of deep lumbar wound.  The wound had a much healthier look in  appearance.  Estimated blood loss  200-400 mL.  Two drains were placed, Blake and one Hemovac.  He was then  returned to [facility].  Elita Quick was discontinued.  They  continued with vancomycin, Ceptaz, and rifampin per ID.  On postop day 1  from second irrigation and debridement, temp max 101.1.  Vital signs  were stable.  Dressing clean, dry, and intact.  Drains intact.  120 mL  output at this point.  Postop day 2, the patient states he was feeling  much better.  T-max was 99.9.  White count 11.3.  Hemovac output 275 mL,  from [place] drains an 85 mL.  Continued vancomycin and rifampin per ID's  recommendations.  Discontinued drains on postop day 3.  The patient  tolerated well.  Wound looks clean and dry and no active drainage.  White count 1500 on Dec 30, 2008, continued antibiotics.  Postop day 5  from second I and D, the patient stable, taking p.o.'s well pending a  new CRP and ESR, continued antibiotics per ID.  He does have a PICC line  placed.  On Jan 01, 2009, CRP was 3.0.  ESR 34.  White count 11,000,  hemoglobin 9.2.  T-max was 99.3.  At this point, we are going to allow  the patient to be discharged home under the care of his wife.  We  ordered home health care for antibiotic treatment through PICC line to  include the vancomycin per ID, and they will continue rifampin 1 tablet  twice daily per recommendation of Infectious Disease.   He will continue his home medications to include:  1. Lisinopril 5 mg daily.  2. Glucophage 500 mg twice daily.  3. Demerol 50 mg 1-2 q.4 h. p.r.n. for pain.  4. Phenergan 25 mg with Demerol for nausea.  5. Methocarbamol 500 mg 1-2 q.6 h. p.r.n. for muscle spasms.  6. Temazepam 50 mg 1-2 at bedtime for sleep.   He will follow up in our office in approximately 4 weeks.  We will order  lab work through his nearby hospital secondary to him living in [place].  If he has any troubles or concerns, he will call us at any time.   FINAL  DIAGNOSIS:  Wound infection status post lumbar fusion, T10-S1.   DISPOSITION:  Stable.   DIET:  Regular.      Lianne Cure, P.A.      Nelda Severe, MD  Electronically Signed    MC/MEDQ  D:  01/15/2009  T:  01/16/2009  Job:  [id number]

## 2011-01-16 NOTE — Discharge Summary (Signed)
[name], [name]              ACCOUNT NO.:  [id number]   MEDICAL RECORD NO.:  [id number]          PATIENT TYPE:  INP   LOCATION:  5034                         FACILITY:  [facility]   PHYSICIAN:  Nelda Severe, MD      DATE OF BIRTH:  04-13-1961   DATE OF ADMISSION:  11/28/2008  DATE OF DISCHARGE:  12/03/2008                               DISCHARGE SUMMARY   DIAGNOSES:  Status post laminectomies, stenosis, spondylosis T10-S1.   OPERATIVE PLAN:  Posterior lumbar fusion T10-S1 with revision of  laminectomies L2-S1.   Postoperatively, the patient was maintained with intubation secondary to  prolonged length of surgery.  The patient received treatment of fentanyl  and Versed secondary to tachycardia at 120 beats per minute, 2 minutes  prior to that he dropped his systolic pressure to 60.  He was given  fluid boluses.  This was managed by ICU, Dr. Marchelle Gearing.  He was at 2300  in intensive care unit.  He was then given another bolus of 500 mL  saline for hypotensive occurrence.  Plan re-bolus fluid, start  phenylephrine via PIV.  Consent for central line monitoring.  There was  a central line placement for monitoring in the unit.  He was extubated  the next day, complained of sore throat, ordered Chloraseptic Spray q.4  p.r.n.  They did sputum cultures on November 30, 2008, which had few Gram-  positive cocci clusters in pairs.  He was given 4 tablets of vancomycin  and  watched closely for hypertensive episodes.  Hemoglobin was stable  at 9.7,  white count 13.9, INR 1.7, PTT 38.  Central line x-ray was  taken on November 30, 2008, showing good placement.  No pneumothorax, mild  basilar atelectasis, continued on Fortaz and vancomycin, fentanyl 10 mcg  for pain control.  On December 01, 2008, the patient was alert and oriented,  continue on the antibiotics.  No overnight events were reported.  He was  on December 02, 2008, afebrile.  Vital signs were stable.  Walked in the  a.m. with physical therapy.   Dressing clean and dry.  Drain was  discontinued.  Central line was discontinued.  On December 03, 2008, the  patient was ambulating independently with rolling walker, still had some  nausea complaints.  He had a blister on the anterior central chest from  surgical positioning and muscle spasms in the right forearm with  tenderness to palpation.  No edema.  No erythema.  He was afebrile.  Hemoglobin is stable at 9.6.  White count 8.1.  Distally neurovascularly  motor intact.  TLSO brace needed some repair from biotech.  We did put  him on a fentanyl patch 50 mcg q.72 h.  We are also going to send him  home with Phenergan and Demerol and Robaxin.   <FINAL/DIAGNOSES/>  Lumbar spondylosis, stenosis T10-S1 with revision of laminectomies.   DISPOSITION:  Stable.   DIET:  Regular.   We will have him to walk for exercise.  No bending, stooping, lifting,  and wears TLSO brace whenever he is up, and he is going to followup in  our office in approximately 3 weeks.      Lianne Cure, P.A.      Nelda Severe, MD  Electronically Signed    MC/MEDQ  D:  12/07/2008  T:  12/08/2008  Job:  [id number]

## 2011-04-04 IMAGING — CR DG CHEST 1V PORT
1 series · 1 of 1 positions shown · non-contrast
Comparison: Plain film chest 12/27/2009.

CLINICAL DATA: Central line and endotracheal tube placement.

PORTABLE CHEST - 1 VIEW

[view not recorded]
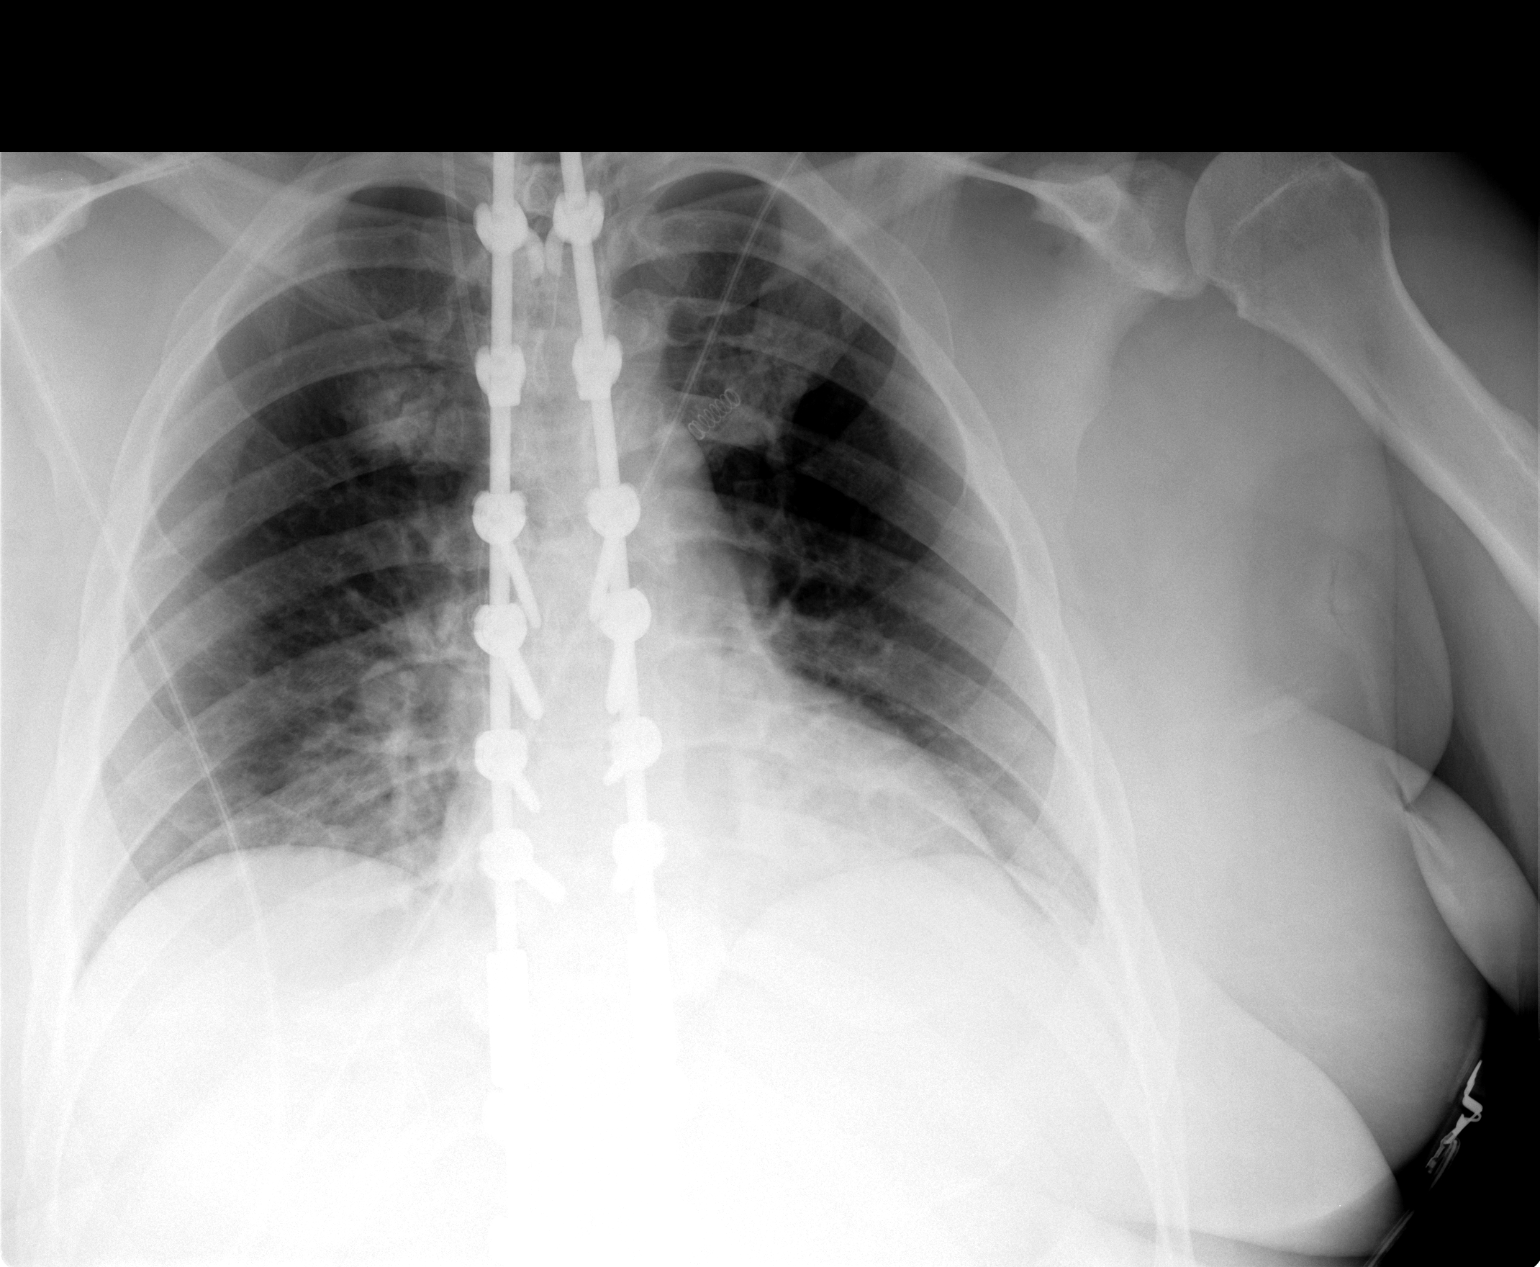

[1 of 1 positions shown; findings below may reference images not displayed]

FINDINGS: The patient has a right IJ catheter in place.  The tip of
the catheter is difficult to visualize due to spinal stabilization
hardware but it appears to lie in the mid superior vena cava.  No
pneumothorax.  Endotracheal tube is in place the tip in good
position at the level clavicular heads.  Lung volumes are low with
some mild basilar atelectasis.  Heart size normal.
IMPRESSION: 1.  Right IJ catheter tip is difficult to visualize due to spinal
stabilization hardware but appears lie in the mid superior vena
cava.  No pneumothorax.
2.  ET tube in good position.
3.  Mild bibasilar atelectasis.

## 2011-08-27 ENCOUNTER — Encounter (HOSPITAL_COMMUNITY): Payer: Self-pay

## 2011-08-27 ENCOUNTER — Encounter (HOSPITAL_COMMUNITY): Payer: Self-pay | Admitting: *Deleted

## 2011-08-27 NOTE — Progress Notes (Addendum)
Requested CXR, EKG, labs from [facility].   LM requesting ECHO and EKG from [place] [facility] in [place].   Pt reports Dr. Alveda Reasons called in [place] prescription as precaution, pt had post surgical infection in the past.

## 2011-08-27 NOTE — Progress Notes (Signed)
Per Deanna in Dr. Toni Arthurs office his PA is aware of surgery tomorrow and will enter orders.

## 2011-08-28 ENCOUNTER — Ambulatory Visit (HOSPITAL_COMMUNITY): Payer: Worker's Compensation

## 2011-08-28 ENCOUNTER — Encounter (HOSPITAL_COMMUNITY)
Admission: RE | Disposition: A | Discharge status: Home / Self Care | Payer: Self-pay | Source: Ambulatory Visit | Attending: Orthopedic Surgery

## 2011-08-28 ENCOUNTER — Encounter (HOSPITAL_COMMUNITY): Payer: Self-pay

## 2011-08-28 ENCOUNTER — Encounter (HOSPITAL_COMMUNITY): Payer: Self-pay | Admitting: Anesthesiology

## 2011-08-28 ENCOUNTER — Ambulatory Visit (HOSPITAL_COMMUNITY): Payer: Worker's Compensation | Admitting: Anesthesiology

## 2011-08-28 ENCOUNTER — Inpatient Hospital Stay (HOSPITAL_COMMUNITY)
Admission: RE | Admit: 2011-08-28 | Discharge: 2011-09-02 | DRG: 460 | Disposition: A | Discharge status: Home / Self Care | Payer: Worker's Compensation | Source: Ambulatory Visit | Attending: Orthopedic Surgery | Admitting: Orthopedic Surgery

## 2011-08-28 DIAGNOSIS — T84498A Other mechanical complication of other internal orthopedic devices, implants and grafts, initial encounter: Principal | ICD-10-CM | POA: Diagnosis present

## 2011-08-28 DIAGNOSIS — M961 Postlaminectomy syndrome, not elsewhere classified: Secondary | ICD-10-CM | POA: Diagnosis present

## 2011-08-28 DIAGNOSIS — E119 Type 2 diabetes mellitus without complications: Secondary | ICD-10-CM

## 2011-08-28 DIAGNOSIS — Z6839 Body mass index (BMI) 39.0-39.9, adult: Secondary | ICD-10-CM

## 2011-08-28 DIAGNOSIS — Y838 Other surgical procedures as the cause of abnormal reaction of the patient, or of later complication, without mention of misadventure at the time of the procedure: Secondary | ICD-10-CM | POA: Diagnosis present

## 2011-08-28 DIAGNOSIS — Z981 Arthrodesis status: Secondary | ICD-10-CM

## 2011-08-28 DIAGNOSIS — M4326 Fusion of spine, lumbar region: Secondary | ICD-10-CM

## 2011-08-28 HISTORY — DX: Essential (primary) hypertension: I10

## 2011-08-28 HISTORY — DX: Presence of external hearing-aid: Z97.4

## 2011-08-28 HISTORY — DX: Unspecified osteoarthritis, unspecified site: M19.90

## 2011-08-28 HISTORY — DX: Presence of spectacles and contact lenses: Z97.3

## 2011-08-28 HISTORY — DX: Encounter for other specified aftercare: Z51.89

## 2011-08-28 LAB — GLUCOSE, CAPILLARY
Glucose-Capillary: 85 mg/dL (ref 70–99)
Glucose-Capillary: 91 mg/dL (ref 70–99)

## 2011-08-28 LAB — CBC
MCV: 89.8 fL (ref 78.0–100.0)
Platelets: 233 10*3/uL (ref 150–400)
RBC: 4.4 MIL/uL (ref 4.22–5.81)
RDW: 13.1 % (ref 11.5–15.5)
WBC: 6.8 10*3/uL (ref 4.0–10.5)

## 2011-08-28 LAB — TYPE AND SCREEN
ABO/RH(D): O POS
Antibody Screen: NEGATIVE

## 2011-08-28 LAB — DIFFERENTIAL
Basophils Absolute: 0 10*3/uL (ref 0.0–0.1)
Eosinophils Absolute: 0.1 10*3/uL (ref 0.0–0.7)
Eosinophils Relative: 2 % (ref 0–5)
Lymphocytes Relative: 41 % (ref 12–46)
Lymphs Abs: 2.8 10*3/uL (ref 0.7–4.0)
Neutrophils Relative %: 49 % (ref 43–77)

## 2011-08-28 LAB — COMPREHENSIVE METABOLIC PANEL
ALT: 15 U/L (ref 0–53)
AST: 19 U/L (ref 0–37)
Alkaline Phosphatase: 83 U/L (ref 39–117)
CO2: 27 mEq/L (ref 19–32)
Calcium: 9.5 mg/dL (ref 8.4–10.5)
Potassium: 4.2 mEq/L (ref 3.5–5.1)
Sodium: 139 mEq/L (ref 135–145)
Total Protein: 7.7 g/dL (ref 6.0–8.3)

## 2011-08-28 SURGERY — POSTERIOR LUMBAR FUSION 1 WITH HARDWARE REMOVAL
Anesthesia: General | Site: Spine Lumbar | Wound class: Clean

## 2011-08-28 MED ORDER — ACETAMINOPHEN 10 MG/ML IV SOLN
INTRAVENOUS | Status: DC | PRN
  Administered 2011-08-28: 1000 mg via INTRAVENOUS

## 2011-08-28 MED ORDER — SODIUM CHLORIDE 0.9 % IV SOLN
10.0000 mg | INTRAVENOUS | Status: DC | PRN
  Administered 2011-08-28: 20 ug/min via INTRAVENOUS

## 2011-08-28 MED ORDER — SODIUM CHLORIDE 0.9 % IV SOLN: INTRAVENOUS | Status: DC

## 2011-08-28 MED ORDER — BUPIVACAINE HCL (PF) 0.25 % IJ SOLN
INTRAMUSCULAR | Status: DC | PRN
  Administered 2011-08-28: 20 mL

## 2011-08-28 MED ORDER — SODIUM CHLORIDE 0.9 % IJ SOLN: 9.0000 mL | INTRAMUSCULAR | Status: DC | PRN

## 2011-08-28 MED ORDER — VANCOMYCIN HCL IN DEXTROSE 1-5 GM/200ML-% IV SOLN
1000.0000 mg | INTRAVENOUS | Status: AC
  Administered 2011-08-28: 1000 mg via INTRAVENOUS
  Filled 2011-08-28: qty 200

## 2011-08-28 MED ORDER — NALOXONE HCL 0.4 MG/ML IJ SOLN: 0.4000 mg | INTRAMUSCULAR | Status: DC | PRN

## 2011-08-28 MED ORDER — FENTANYL CITRATE 0.05 MG/ML IJ SOLN
INTRAMUSCULAR | Status: DC | PRN
  Administered 2011-08-28 (×5): 50 ug via INTRAVENOUS

## 2011-08-28 MED ORDER — ONDANSETRON HCL 4 MG/2ML IJ SOLN: 4.0000 mg | Freq: Four times a day (QID) | INTRAMUSCULAR | Status: DC | PRN

## 2011-08-28 MED ORDER — BUPIVACAINE LIPOSOME 1.3 % IJ SUSP
20.0000 mL | INTRAMUSCULAR | Status: AC
  Administered 2011-08-28: 20 mL
  Filled 2011-08-28: qty 20

## 2011-08-28 MED ORDER — SODIUM CHLORIDE 0.9 % IV SOLN
0.4000 ug/kg/h | INTRAVENOUS | Status: DC
  Filled 2011-08-28: qty 2

## 2011-08-28 MED ORDER — METHOCARBAMOL 500 MG PO TABS
500.0000 mg | ORAL_TABLET | Freq: Four times a day (QID) | ORAL | Status: DC | PRN
  Administered 2011-08-31 – 2011-09-02 (×4): 500 mg via ORAL
  Filled 2011-08-28 (×5): qty 1

## 2011-08-28 MED ORDER — LACTATED RINGERS IV SOLN
INTRAVENOUS | Status: DC | PRN
  Administered 2011-08-28 (×4): via INTRAVENOUS

## 2011-08-28 MED ORDER — DOCUSATE SODIUM 100 MG PO CAPS
100.0000 mg | ORAL_CAPSULE | Freq: Two times a day (BID) | ORAL | Status: DC
  Administered 2011-08-28 – 2011-09-02 (×10): 100 mg via ORAL
  Filled 2011-08-28 (×11): qty 1

## 2011-08-28 MED ORDER — SODIUM CHLORIDE 0.9 % IV SOLN
0.4000 ug/kg/h | INTRAVENOUS | Status: DC
  Filled 2011-08-28: qty 4

## 2011-08-28 MED ORDER — MIDAZOLAM HCL 5 MG/5ML IJ SOLN
INTRAMUSCULAR | Status: DC | PRN
  Administered 2011-08-28: 2 mg via INTRAVENOUS

## 2011-08-28 MED ORDER — VITAMIN D3 25 MCG (1000 UNIT) PO TABS
4000.0000 [IU] | ORAL_TABLET | Freq: Every day | ORAL | Status: DC
  Administered 2011-08-29 – 2011-08-31 (×3): 4000 [IU] via ORAL
  Administered 2011-09-01: 1000 [IU] via ORAL
  Administered 2011-09-02: 4000 [IU] via ORAL
  Filled 2011-08-28 (×5): qty 4

## 2011-08-28 MED ORDER — LIDOCAINE-EPINEPHRINE (PF) 1 %-1:200000 IJ SOLN
INTRAMUSCULAR | Status: DC | PRN
  Administered 2011-08-28: 20 mL

## 2011-08-28 MED ORDER — PROMETHAZINE HCL 25 MG PO TABS
25.0000 mg | ORAL_TABLET | Freq: Three times a day (TID) | ORAL | Status: DC | PRN
  Administered 2011-08-29: 25 mg via ORAL

## 2011-08-28 MED ORDER — BACITRACIN-NEOMYCIN-POLYMYXIN 400-5-5000 EX OINT
TOPICAL_OINTMENT | CUTANEOUS | Status: DC | PRN
  Administered 2011-08-28: 1 via TOPICAL

## 2011-08-28 MED ORDER — FLEET ENEMA 7-19 GM/118ML RE ENEM: 1.0000 | ENEMA | Freq: Once | RECTAL | Status: AC | PRN

## 2011-08-28 MED ORDER — PROPOFOL 10 MG/ML IV EMUL
INTRAVENOUS | Status: DC | PRN
  Administered 2011-08-28: 200 mg via INTRAVENOUS

## 2011-08-28 MED ORDER — FENTANYL 10 MCG/ML IV SOLN
INTRAVENOUS | Status: DC
  Administered 2011-08-28: 20:00:00 via INTRAVENOUS
  Filled 2011-08-28: qty 50

## 2011-08-28 MED ORDER — ONDANSETRON HCL 4 MG PO TABS: 4.0000 mg | ORAL_TABLET | Freq: Four times a day (QID) | ORAL | Status: DC | PRN

## 2011-08-28 MED ORDER — ACETAMINOPHEN 10 MG/ML IV SOLN: 1000.0000 mg | Freq: Once | INTRAVENOUS | Status: DC | PRN

## 2011-08-28 MED ORDER — MEPERIDINE HCL 25 MG/ML IJ SOLN: 6.2500 mg | INTRAMUSCULAR | Status: DC | PRN

## 2011-08-28 MED ORDER — ONDANSETRON HCL 4 MG/2ML IJ SOLN
INTRAMUSCULAR | Status: DC | PRN
  Administered 2011-08-28: 4 mg via INTRAVENOUS

## 2011-08-28 MED ORDER — DIPHENHYDRAMINE HCL 12.5 MG/5ML PO ELIX
12.5000 mg | ORAL_SOLUTION | ORAL | Status: DC | PRN
  Filled 2011-08-28: qty 10

## 2011-08-28 MED ORDER — SENNA 8.6 MG PO TABS
1.0000 | ORAL_TABLET | Freq: Two times a day (BID) | ORAL | Status: DC
  Administered 2011-08-29 – 2011-09-02 (×8): 8.6 mg via ORAL
  Filled 2011-08-28 (×10): qty 1

## 2011-08-28 MED ORDER — ONDANSETRON HCL 4 MG/2ML IJ SOLN: 4.0000 mg | Freq: Once | INTRAMUSCULAR | Status: DC | PRN

## 2011-08-28 MED ORDER — DIPHENHYDRAMINE HCL 50 MG/ML IJ SOLN: 12.5000 mg | Freq: Four times a day (QID) | INTRAMUSCULAR | Status: DC | PRN

## 2011-08-28 MED ORDER — METOCLOPRAMIDE HCL 10 MG PO TABS: 5.0000 mg | ORAL_TABLET | Freq: Three times a day (TID) | ORAL | Status: DC | PRN

## 2011-08-28 MED ORDER — ACETAMINOPHEN 10 MG/ML IV SOLN
INTRAVENOUS | Status: AC
  Filled 2011-08-28: qty 100

## 2011-08-28 MED ORDER — THROMBIN 20000 UNITS EX KIT
PACK | CUTANEOUS | Status: DC | PRN
  Administered 2011-08-28: 14:00:00 via TOPICAL

## 2011-08-28 MED ORDER — INSULIN ASPART 100 UNIT/ML ~~LOC~~ SOLN
0.0000 [IU] | Freq: Three times a day (TID) | SUBCUTANEOUS | Status: DC
  Administered 2011-08-29: 4 [IU] via SUBCUTANEOUS
  Filled 2011-08-28 (×2): qty 3

## 2011-08-28 MED ORDER — GLYCOPYRROLATE 0.2 MG/ML IJ SOLN
INTRAMUSCULAR | Status: DC | PRN
  Administered 2011-08-28: .8 mg via INTRAVENOUS

## 2011-08-28 MED ORDER — METOCLOPRAMIDE HCL 5 MG/ML IJ SOLN
5.0000 mg | Freq: Three times a day (TID) | INTRAMUSCULAR | Status: DC | PRN
  Filled 2011-08-28: qty 2

## 2011-08-28 MED ORDER — DIPHENHYDRAMINE HCL 12.5 MG/5ML PO ELIX
12.5000 mg | ORAL_SOLUTION | Freq: Four times a day (QID) | ORAL | Status: DC | PRN
  Filled 2011-08-28: qty 5

## 2011-08-28 MED ORDER — LEVETIRACETAM 250 MG PO TABS
250.0000 mg | ORAL_TABLET | Freq: Three times a day (TID) | ORAL | Status: DC
  Administered 2011-08-28 – 2011-09-02 (×14): 250 mg via ORAL
  Filled 2011-08-28 (×16): qty 1

## 2011-08-28 MED ORDER — SODIUM CHLORIDE 0.9 % IV SOLN
200.0000 ug | INTRAVENOUS | Status: DC | PRN
  Administered 2011-08-28: 16:00:00 via INTRAVENOUS
  Administered 2011-08-28: .6 ug/kg/h via INTRAVENOUS

## 2011-08-28 MED ORDER — HEMOSTATIC AGENTS (NO CHARGE) OPTIME
TOPICAL | Status: DC | PRN
  Administered 2011-08-28: 1 via TOPICAL

## 2011-08-28 MED ORDER — MICROFIBRILLAR COLL HEMOSTAT EX PADS
MEDICATED_PAD | CUTANEOUS | Status: DC | PRN
  Administered 2011-08-28: 1 via TOPICAL

## 2011-08-28 MED ORDER — VANCOMYCIN HCL IN DEXTROSE 1-5 GM/200ML-% IV SOLN
1000.0000 mg | Freq: Two times a day (BID) | INTRAVENOUS | Status: AC
  Administered 2011-08-29: 1000 mg via INTRAVENOUS
  Filled 2011-08-28: qty 200

## 2011-08-28 MED ORDER — METHOCARBAMOL 100 MG/ML IJ SOLN
500.0000 mg | Freq: Four times a day (QID) | INTRAMUSCULAR | Status: DC | PRN
  Filled 2011-08-28 (×2): qty 5

## 2011-08-28 MED ORDER — VANCOMYCIN HCL 1000 MG IV SOLR
1000.0000 mg | INTRAVENOUS | Status: AC
  Administered 2011-08-28: 1 g
  Filled 2011-08-28: qty 1000

## 2011-08-28 MED ORDER — NEOSTIGMINE METHYLSULFATE 1 MG/ML IJ SOLN
INTRAMUSCULAR | Status: DC | PRN
  Administered 2011-08-28: 5 mg via INTRAVENOUS

## 2011-08-28 MED ORDER — SODIUM CHLORIDE 0.9 % IR SOLN
Status: DC | PRN
  Administered 2011-08-28 (×3)

## 2011-08-28 MED ORDER — POLYETHYLENE GLYCOL 3350 17 G PO PACK
17.0000 g | PACK | Freq: Every day | ORAL | Status: DC | PRN
  Administered 2011-08-31: 17 g via ORAL
  Filled 2011-08-28: qty 1

## 2011-08-28 MED ORDER — BISACODYL 10 MG RE SUPP: 10.0000 mg | Freq: Every day | RECTAL | Status: DC | PRN

## 2011-08-28 MED ORDER — LACTATED RINGERS IV SOLN
INTRAVENOUS | Status: DC
  Administered 2011-08-28 – 2011-08-29 (×2): via INTRAVENOUS

## 2011-08-28 MED ORDER — LORATADINE 10 MG PO TABS
10.0000 mg | ORAL_TABLET | Freq: Every day | ORAL | Status: DC
  Administered 2011-08-29 – 2011-09-02 (×5): 10 mg via ORAL
  Filled 2011-08-28 (×5): qty 1

## 2011-08-28 MED ORDER — METFORMIN HCL 500 MG PO TABS
500.0000 mg | ORAL_TABLET | Freq: Every day | ORAL | Status: DC
  Filled 2011-08-28 (×2): qty 1

## 2011-08-28 MED ORDER — LISINOPRIL 10 MG PO TABS
10.0000 mg | ORAL_TABLET | Freq: Every day | ORAL | Status: DC
  Administered 2011-08-29 – 2011-08-30 (×2): 10 mg via ORAL
  Filled 2011-08-28 (×3): qty 1

## 2011-08-28 MED ORDER — MAGNESIUM OXIDE 400 MG PO TABS
400.0000 mg | ORAL_TABLET | Freq: Every day | ORAL | Status: DC
  Administered 2011-08-29 – 2011-09-02 (×5): 400 mg via ORAL
  Filled 2011-08-28 (×5): qty 1

## 2011-08-28 MED ORDER — ROCURONIUM BROMIDE 100 MG/10ML IV SOLN
INTRAVENOUS | Status: DC | PRN
  Administered 2011-08-28 (×2): 10 mg via INTRAVENOUS
  Administered 2011-08-28: 20 mg via INTRAVENOUS
  Administered 2011-08-28: 50 mg via INTRAVENOUS
  Administered 2011-08-28: 10 mg via INTRAVENOUS

## 2011-08-28 SURGICAL SUPPLY — 93 items
3.2MM X 18.3MM HELIOCOIDAL RASP MEDIUM ×2 IMPLANT
BAG DECANTER FOR FLEXI CONT (MISCELLANEOUS) ×2 IMPLANT
BLADE SURG 15 STRL LF DISP TIS (BLADE) IMPLANT
BLADE SURG 15 STRL SS (BLADE)
BLADE SURG ROTATE 9660 (MISCELLANEOUS) ×2 IMPLANT
BUR MATCHSTICK NEURO 3.0 LAGG (BURR) IMPLANT
BUR NEURO DRILL SOFT 3.0X3.8M (BURR) ×2 IMPLANT
CARTRIDGE OIL MAESTRO DRILL (MISCELLANEOUS) ×1 IMPLANT
CLOTH BEACON ORANGE TIMEOUT ST (SAFETY) ×2 IMPLANT
CONT SPECI 4OZ STER CLIK (MISCELLANEOUS) ×2 IMPLANT
CORDS BIPOLAR (ELECTRODE) ×2 IMPLANT
COVER SURGICAL LIGHT HANDLE (MISCELLANEOUS) ×2 IMPLANT
DECANTER SPIKE VIAL GLASS SM (MISCELLANEOUS) IMPLANT
DERMABOND ADVANCED (GAUZE/BANDAGES/DRESSINGS) ×1
DERMABOND ADVANCED .7 DNX12 (GAUZE/BANDAGES/DRESSINGS) ×1 IMPLANT
DIFFUSER DRILL AIR PNEUMATIC (MISCELLANEOUS) ×2 IMPLANT
DRAIN CHANNEL 15F RND FF W/TCR (WOUND CARE) ×2 IMPLANT
DRAIN TLS ROUND 10FR (DRAIN) IMPLANT
DRAPE PROXIMA HALF (DRAPES) ×4 IMPLANT
DRAPE SURG 17X23 STRL (DRAPES) ×10 IMPLANT
DRAPE TABLE COVER HEAVY DUTY (DRAPES) ×2 IMPLANT
DRSG MEPILEX BORDER 4X4 (GAUZE/BANDAGES/DRESSINGS) IMPLANT
DRSG MEPILEX BORDER 4X8 (GAUZE/BANDAGES/DRESSINGS) IMPLANT
DRSG OPSITE 11X17.75 LRG (GAUZE/BANDAGES/DRESSINGS) ×2 IMPLANT
DURAPREP 26ML APPLICATOR (WOUND CARE) ×2 IMPLANT
ELECT BLADE 4.0 EZ CLEAN MEGAD (MISCELLANEOUS) ×2
ELECT CAUTERY BLADE 6.4 (BLADE) ×2 IMPLANT
ELECT REM PT RETURN 9FT ADLT (ELECTROSURGICAL) ×2
ELECTRODE BLDE 4.0 EZ CLN MEGD (MISCELLANEOUS) ×1 IMPLANT
ELECTRODE REM PT RTRN 9FT ADLT (ELECTROSURGICAL) ×1 IMPLANT
EVACUATOR 1/8 PVC DRAIN (DRAIN) ×2 IMPLANT
EVACUATOR SILICONE 100CC (DRAIN) ×2 IMPLANT
GAUZE SPONGE 4X4 12PLY STRL LF (GAUZE/BANDAGES/DRESSINGS) ×2 IMPLANT
GAUZE SPONGE 4X4 16PLY XRAY LF (GAUZE/BANDAGES/DRESSINGS) ×2 IMPLANT
GLOVE BIOGEL PI IND STRL 7.5 (GLOVE) ×2 IMPLANT
GLOVE BIOGEL PI IND STRL 9 (GLOVE) ×3 IMPLANT
GLOVE BIOGEL PI INDICATOR 7.5 (GLOVE) ×2
GLOVE BIOGEL PI INDICATOR 9 (GLOVE) ×3
GLOVE ECLIPSE 8.5 STRL (GLOVE) ×4 IMPLANT
GLOVE SS BIOGEL STRL SZ 7 (GLOVE) ×2 IMPLANT
GLOVE SS BIOGEL STRL SZ 8.5 (GLOVE) ×2 IMPLANT
GLOVE SUPERSENSE BIOGEL SZ 7 (GLOVE) ×2
GLOVE SUPERSENSE BIOGEL SZ 8.5 (GLOVE) ×2
GLOVE SURG SS PI 7.5 STRL IVOR (GLOVE) ×4 IMPLANT
GOWN PREVENTION PLUS XLARGE (GOWN DISPOSABLE) ×2 IMPLANT
GOWN STRL NON-REIN LRG LVL3 (GOWN DISPOSABLE) ×4 IMPLANT
HEMOSTAT NU-KNIT SURGICAL 3X4 (HEMOSTASIS) ×4 IMPLANT
KIT BASIN OR (CUSTOM PROCEDURE TRAY) ×2 IMPLANT
KIT POSITION SURG JACKSON T1 (MISCELLANEOUS) ×2 IMPLANT
KIT ROOM TURNOVER OR (KITS) ×2 IMPLANT
MANIFOLD NEPTUNE II (INSTRUMENTS) ×2 IMPLANT
NEEDLE 22X1 1/2 (OR ONLY) (NEEDLE) ×2 IMPLANT
NEEDLE HYPO 25X1 1.5 SAFETY (NEEDLE) ×2 IMPLANT
NEEDLE SPNL 18GX3.5 QUINCKE PK (NEEDLE) ×2 IMPLANT
NEEDLE SPNL 22GX3.5 QUINCKE BK (NEEDLE) ×2 IMPLANT
NS IRRIG 1000ML POUR BTL (IV SOLUTION) ×2 IMPLANT
OIL CARTRIDGE MAESTRO DRILL (MISCELLANEOUS) ×2
PACK LAMINECTOMY ORTHO (CUSTOM PROCEDURE TRAY) ×2 IMPLANT
PACK UNIVERSAL I (CUSTOM PROCEDURE TRAY) ×2 IMPLANT
PAD ARMBOARD 7.5X6 YLW CONV (MISCELLANEOUS) ×6 IMPLANT
PATTIES SURGICAL .5 X1 (DISPOSABLE) ×2 IMPLANT
PATTIES SURGICAL .75X.75 (GAUZE/BANDAGES/DRESSINGS) IMPLANT
PATTIES SURGICAL 1X1 (DISPOSABLE) IMPLANT
PUTTY OP1 (Orthopedic Implant) ×2 IMPLANT
ROD CONNECTOR OPEN/CLOSED (Rod) ×2 IMPLANT
ROD STRAIGHT 120MM (Rod) ×2 IMPLANT
ROD TEMPLATE (Rod) ×2 IMPLANT
Rod, Curved 125mm ×2 IMPLANT
SCREW SET ATR (Screw) ×2 IMPLANT
SPONGE GAUZE 4X4 12PLY (GAUZE/BANDAGES/DRESSINGS) ×4 IMPLANT
SPONGE NEURO XRAY DETECT 1X3 (DISPOSABLE) IMPLANT
SPONGE SURGIFOAM ABS GEL 100 (HEMOSTASIS) ×2 IMPLANT
SURGIFLO TRUKIT (HEMOSTASIS) ×4 IMPLANT
SUT ETHILON 2 0 FS 18 (SUTURE) ×14 IMPLANT
SUT VIC AB 0 CTX 18 (SUTURE) ×4 IMPLANT
SUT VIC AB 1 CT1 18XCR BRD 8 (SUTURE) ×1 IMPLANT
SUT VIC AB 1 CT1 8-18 (SUTURE) ×1
SUT VIC AB 1 CTX 18 (SUTURE) ×6 IMPLANT
SUT VIC AB 1 CTX 36 (SUTURE)
SUT VIC AB 1 CTX36XBRD ANBCTR (SUTURE) IMPLANT
SUT VIC AB 2-0 CT1 18 (SUTURE) ×4 IMPLANT
SUT VIC AB 2-0 CT1 27 (SUTURE) ×2
SUT VIC AB 2-0 CT1 TAPERPNT 27 (SUTURE) ×2 IMPLANT
SUT VIC AB 3-0 X1 27 (SUTURE) IMPLANT
SYR BULB IRRIGATION 50ML (SYRINGE) ×2 IMPLANT
SYR CONTROL 10ML LL (SYRINGE) ×2 IMPLANT
SYRINGE 10CC LL (SYRINGE) ×2 IMPLANT
SYSTEM CHEST DRAIN TLS 7FR (DRAIN) IMPLANT
TOWEL OR 17X24 6PK STRL BLUE (TOWEL DISPOSABLE) ×2 IMPLANT
TOWEL OR 17X26 10 PK STRL BLUE (TOWEL DISPOSABLE) ×2 IMPLANT
TRAY FOLEY CATH 14FR (SET/KITS/TRAYS/PACK) ×2 IMPLANT
TUBE SUCT ARGYLE STRL (TUBING) ×2 IMPLANT
WATER STERILE IRR 1000ML POUR (IV SOLUTION) IMPLANT

## 2011-08-28 NOTE — OR Nursing (Signed)
Patient's bilateral arms moved through range of motion by Lynelle Smoke, CRNA at [phone]

## 2011-08-28 NOTE — Anesthesia Preprocedure Evaluation (Addendum)
Anesthesia Evaluation  Patient identified by MRN, date of birth, ID band Patient awake    Reviewed: Allergy & Precautions, H&P , NPO status   Airway Mallampati: III TM Distance: >3 FB Neck ROM: Full    Dental  (+) Teeth Intact   Pulmonary  clear to auscultation        Cardiovascular Regular Normal    Neuro/Psych    GI/Hepatic   Endo/Other    Renal/GU      Musculoskeletal   Abdominal (+)  Abdomen: soft.    Peds  Hematology   Anesthesia Other Findings   Reproductive/Obstetrics                           Anesthesia Physical Anesthesia Plan  ASA: III  Anesthesia Plan: General   Post-op Pain Management:    Induction: Intravenous  Airway Management Planned: Oral ETT  Additional Equipment:   Intra-op Plan:   Post-operative Plan: Extubation in OR  Informed Consent: I have reviewed the patients History and Physical, chart, labs and discussed the procedure including the risks, benefits and alternatives for the proposed anesthesia with the patient or authorized representative who has indicated his/her understanding and acceptance.   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 Pt has chronic low back pain and multiple allergies, htn, and Type 2 DM. Possible difficult airway.  Plan GA with ETT.  Kipp Brood, MD)        Anesthesia Quick Evaluation

## 2011-08-28 NOTE — H&P (Signed)
.   PATIENT: Christopher [name] DATE OF BIRTH: 08-20-1961 DATE: 08/26/2011 11:39 AM  VISIT TYPE: Pre Op Visit HISTORIAN: self DATE OF INJURY:    Patient was seen in the PA clinic today.   History of Present Illness: This 50 year old  male presents with: 1.  lumbar spine  Surgery history and physical.  Patient rates pain as 8 out of 10.  Pain is radiating to the right leg and left leg.  The patient describes the pain as an ache, sharp and stabbing.  Symptoms are aggravated by standing and walking.  Symptoms are relieved by sitting and changing positions.  If he stays perfectly still his pain is less until he moves. Past Medical History:    Disease Year  Diabetes    Past Surgical History: Procedure/Surgery Side Year  Fusion  2010   Bilateral CTR  2008   Bilateral Ulnar nerve  2008   Cervical Surgery  2006   Hand Surgery        T3-S1 Fusion  2011    Allergies:  Allergen/Ingredient Brand Reaction  FENTANYL  stopped breathing  MORPHINE    ACETAMINOPHEN Vicodin   PENICILLINS    MELOXICAM Mobic   PREGABALIN Lyrica   HYDROCODONE BIT Vicodin   CYCLOBENZAPRINE HCL Flexeril   CODEINE    PRESERVATIVE FREE Dilaudid   HYDROCODONE    HYDROMORPHONE HCL Dilaudid   AMITRIPTYLINE    ADHESIVE    METAXALONE Skelaxin     Physical Exam General General Appearance: ill appearing Gait/Station: forward flexed, cane  Comments:  He laid on the exam table for comfort.  He is in sever pain when he moves, and if he lays very still he does not have as much pain. Lumbar Spine Evaluation ROM: not tested secondary to pain Surgery is scheduled with  at [facility]. The surgeon will be assisted by . This surgery is inpatient. Assessment / Plan Postsurgical arthrodesis status (V45.4) Postlaminectomy syndrome of lumbar region (722.83) Morbid obesity (278.01) Postlaminectomy syndrome of lumbar region (722.83) Pain - low back (724.2) Scoliosis/Kyphosis (737.30)  Clinical  Assessment: CT scan was reviewed with Dr.Tooke and it shows a fusion mass fracture L3-4 and a rod fracture at L4-5 right sided.  In view of the current situation we recommend revpair of the pseudoarthrosis.  He will be admitted to [facility] 08-28-11 for revision posterior lumbar pseudoarthrosis L3-4.  He will be in the hospital 3-5 days.  He is in pain management and we will make adjustments as needed.  He will f/u in the office in 3 weeks post op.   Provider:  Mosetta Pigeon PA-C Encounter submitted for review by Mosetta Pigeon PA-C on 08/28/2011 10:34 AM. .  Document generated by:  Willa Rough. [place], PA-C  08/28/2011 [address]    [place], [place] [address]             Phone: [phone]     Fax: [phone]

## 2011-08-28 NOTE — OR Nursing (Signed)
Patient's bilateral arms moved in range of motion by R. Hampton Abbot, CRNA at [phone]

## 2011-08-28 NOTE — Interval H&P Note (Signed)
He is examined in the preop area today.  Physical exam:  Large morbidly obese man, cooperative and lucid  HEENT: clear  Chest: normal breath sounds, gynecomastia (probably secondary to obesity)  CVS: reg rhythm, normal heart sounds, no carotic bruits, no peripheral edema  ABD.; soft, obese, nontender  GU: not examined  Ext's/Muskulloskeletal/Neuro:  No weakness either lower extremity, decreased light touch sensation left upper and lower extremities, AK's and KJ's 0/4 bilaterally  Imaging:  Recent CT shows fracture through the posterior fusion mass with rod breakage  Dx: acute pain associated with implant and fusion mass fracture  Plan:  Re-operate to repair pseudoarthrosis and revise fixation.  The informed consent process was executed including a discussion of general complications and specific complications.

## 2011-08-28 NOTE — OR Nursing (Signed)
Patient's bilateral arms moved through range of motion by Lynelle Smoke, CRNA at [phone]

## 2011-08-28 NOTE — H&P (Signed)
CC: severe back pain sp/multiple spinal surgeries

## 2011-08-28 NOTE — Brief Op Note (Signed)
08/28/2011  7:03 PM  PATIENT:  Christopher [name]  50 y.o. male  PRE-OPERATIVE DIAGNOSIS:  Post Surgical Arthrodesis, Post Laminectomy Syndrome, failed spinal implants, L3-4 pseudoarthrosis, morbid obesity  POST-OPERATIVE DIAGNOSIS:  Same  PROCEDURE:  L3-4 fusion, revision/reinsertion segmental fixation lumbar spine  SURGEON:  Karlton Maya,S Owenn Rothermel  PHYSICIAN ASSISTANT: E.M. Collins, PA-C  ANESTHESIA:   General  EBL: 500 ml.  Drains:  Medium Hemovac and 15 ga. Blake drain  EXAM IN RECOVERY ROOM: Very drowsy, no exam possible.  Note: Patient is not truly allergic to fentanyl.  Anaesthesia has given him IV fentanyl without wheezing or hives.  His list of drug allergies literally includes all available choices.  Therefore we are placing him on PCA fentanyl.

## 2011-08-28 NOTE — Preoperative (Signed)
Beta Blockers   Reason not to administer Beta Blockers:Not Applicable 

## 2011-08-28 NOTE — Anesthesia Postprocedure Evaluation (Signed)
  Anesthesia Post-op Note  Patient: Christopher [name]  Procedure(s) Performed:  POSTERIOR LUMBAR FUSION 1 WITH HARDWARE REMOVAL -  REPAIR L3-4 PSEUDOARTHROSIS, REVISIE BROCKEN ROD AND REINSERT SCREWS.  Patient Location: PACU  Anesthesia Type: General  Level of Consciousness: awake  Airway and Oxygen Therapy: Patient Spontanous Breathing  Post-op Pain: mild  Post-op Assessment: Post-op Vital signs reviewed  Post-op Vital Signs: stable  Complications: No apparent anesthesia complications

## 2011-08-28 NOTE — Progress Notes (Signed)
Call to Dr. Noreene Larsson, reviewed CXR fr. 10/2010, also reported pt. Had "echo" at [facility] 07/2011.   [facility].  Call to [facility] x2- no answer.  Faxed signed release to [place]........result pending.

## 2011-08-28 NOTE — Transfer of Care (Signed)
Immediate Anesthesia Transfer of Care Note  Patient: Christopher [name]  Procedure(s) Performed:  POSTERIOR LUMBAR FUSION 1 WITH HARDWARE REMOVAL -  REPAIR L3-4 PSEUDOARTHROSIS, REVISIE BROCKEN ROD AND REINSERT SCREWS.  Patient Location: PACU  Anesthesia Type: General  Level of Consciousness: sedated  Airway & Oxygen Therapy: Patient connected to nasal cannula oxygen  Post-op Assessment: Report given to PACU RN and Post -op Vital signs reviewed and stable  Post vital signs: Reviewed and stable  Complications: No apparent anesthesia complications

## 2011-08-29 LAB — GLUCOSE, CAPILLARY

## 2011-08-29 MED ORDER — ACETAMINOPHEN 325 MG PO TABS: 650.0000 mg | ORAL_TABLET | Freq: Four times a day (QID) | ORAL | Status: AC | PRN

## 2011-08-29 MED ORDER — FENTANYL 10 MCG/ML IV SOLN
INTRAVENOUS | Status: DC
  Administered 2011-08-29: 22:00:00 via INTRAVENOUS
  Administered 2011-08-29: 15 ug via INTRAVENOUS
  Administered 2011-08-30: 10 mL via INTRAVENOUS
  Administered 2011-08-31: 30 ug via INTRAVENOUS
  Filled 2011-08-29 (×4): qty 50

## 2011-08-29 MED ORDER — METFORMIN HCL 500 MG PO TABS
500.0000 mg | ORAL_TABLET | Freq: Every day | ORAL | Status: DC
  Administered 2011-08-29 – 2011-08-31 (×3): 500 mg via ORAL
  Filled 2011-08-29 (×5): qty 1

## 2011-08-29 NOTE — Op Note (Signed)
Christopher [name], Christopher [name]              ACCOUNT NO.:  [id number]  MEDICAL RECORD NO.:  [id number]  LOCATION:  5006                         FACILITY:  [facility]  PHYSICIAN:  Nelda Severe, MD      DATE OF BIRTH:  [birth year]  DATE OF PROCEDURE:  08/28/2011 DATE OF DISCHARGE:                              OPERATIVE REPORT   PREOPERATIVE DIAGNOSES: 1. Status post proximal thoracic to S1 fusion. 2. Pseudoarthrosis, L3-4 with the rod fracture and posterolateral bone     mass fracture (T).  POSTOPERATIVE DIAGNOSES: 1. Status post proximal thoracic to S1 fusion. 2. Pseudoarthrosis, L3-4 with the rod fracture and posterolateral bone     mass fracture (T).  PROCEDURE:  Repair of L3-4 pseudoarthrosis (L3-4 fusion); revision of segmental fixation of lumbar spine.  Titan replacement broken rod, re- instrumentation S1-L3 with use of parallel rod to side connectors to span L2-3 to L1-2, placement of morselized local autograft mixed with OP- 1 (BNP-7)posterolaterally bilaterally.  SURGEON:  Nelda Severe, MD  ASSISTANT:  Lianne Cure, PA-C  OPERATIVE NOTE:  The patient was placed under general endotracheal anesthesia.  Foley catheter was placed in the bladder.  Sequential compression devices were placed on both lower extremities.  Intravenous vancomycin was administered for prophylaxis against infection.  It should be noted that the chart erroneously asleep, that is in error, states that he is allergic to vancomycin.  He is not, and in fact, he has been on 1 course of protracted intravenous vancomycin therapy without any evidence of allergy.  He was positioned on the [place] frame.  Care was taken to position the upper extremities, so as to avoid hyperflexion and abduction of the shoulders, so as to avoid hyperflexion of the elbows.  The upper extremities were padded with foam from axilla to hands.  The thighs, knees, shins, and ankles were supported on pillows.  He is extremely morbidly obese  and great care was taken in the positioning.  Also, the anesthesia team was asked to put his arms through a range of motion every hour in order to try to obviate any problem of brachial plexus palsy.  The lumbar and thoracic areas were prepped with DuraPrep and draped in a rectangular fashion and the drapes secured with Ioban.  A rectangular draping was carried out.  At this point, a time-out was held at which point, usual parameters were discussed/confirmed.  The lower two-thirds of his thoracolumbar incision were incised and the subcutaneous tissue injected with a mixture of 0.25% plain Marcaine and 1% lidocaine with epinephrine.  Dissection was then deepened with cutting current through the thoracolumbar fascia and the paraspinal muscle and scar mobilized bilaterally to reveal pedicle screws and rods. Incision was then deepened distally and the paraspinal muscles and scar reflected bilaterally following the screws and rods down to the S1 level on the right side, the side of the fracture and on the left side to L5. The fractured rod was identified.  We then used an osteotome to remove a portion of the posterolateral fusion mass on the left side and eventually identified what presumably is a fatigue fracture through the fusion mass as evidenced on the coronal sections of the CT scan.  We then set about re-instrumenting the lumbar spine on the right side, the side of the fractured rod.  The set screws were removed from the [place] Spine in situ pedicle screws.  It became evident that it was going to be very hard to remove those screws and they were left in place.  The broken rod was removed.  We then, using a high-speed carbide bit, cut through the rod proximally at the level above the L3 screw.  We then contoured new 5.5 mm titanium rod and placed it from S1-L3 proximally. There was already a third central rod in place which originates from a iliac screw distally.  This rod was attached  side-to-side with a Domino coupling to the intact rod above the level where we cut the rod off.  We then attached another side-to-side connector to the new rod we were placing.  The side-to-side connector was at the L2-3 level and connected to the central rod.  This required a great deal of effort in terms of contouring the rods, and burring away some of the fusion mass to allow [place] to place in situ benders, etc.  Ultimately, however, we were able to attach the new rod to the previously placed [place] Spine pedicle screws. The set screws were tightened down until the titanium squeaked.  The new side-to-side connector was from [employer] and was torqued until the breaking point on the torque screwdriver.  The previous Domino had been disassembled and was put back together and the screws tightened until it was squeaking.  On the right side, where the fusion mass had never consolidated, we exposed the fusion mass above and below the L3-4 level and burred it to raw bleeding bone.  The local bone which had been harvested was put through a bone mill and mixed with 1 batch of OP-1 (BMP-7) and the graft divided in 2.  We formed a Surgicel tube around half of the graft and sewed it shut with 2-0 Vicryl.  This was placed posterolaterally on the right side.  Similarly, the rest of the graft was placed in a Surgicel tube, sewed together with Vicryl and placed posterolaterally with the pseudoarthrosis that had been identified.  AP and lateral radiographs were taken and showed satisfactory position of the implants.  The wound was irrigated on numerous occasions with copious quantities of antibiotic solution.  Cell Saver was used but none of the blood was scavenged to provide any transfuse of blood.  The blood loss was estimated at 450 mL.  Vancomycin 1 g powder was placed in the wound prior to closing.  A 15-gauge Blake drain was placed subfascially and brought out through the skin to the right side where it was  secured with a 2-0 nylon suture.  The thoracolumbar fascia and paraspinal muscle were closed using numerous #1 Vicryl sutures in interrupted vertical mattress fashion.  An 8-inch Hemovac drain was placed in the subcutaneous layer, brought out through the skin to the right side where it was secured with a 2-0 nylon suture.  The subcutaneous layer was closed using numerous inverted interrupted Vicryl sutures, 0 and 2-0. The skin was closed using interrupted vertical mattress sutures of 2-0 nylon.  A nonadherent antibiotic ointment dressing was applied and secured with a 4 x 4 gauze and OpSite.  As noted, the blood loss was approximately 450 mL.  There were no intraoperative complications.  The sponge and needle counts were correct.  At the time of dictation, the patient had not returned to  the recovery room and no examination was reported here for that reason.     Nelda Severe, MD    MT/MEDQ  D:  08/28/2011  T:  08/29/2011  Job:  [id number]

## 2011-08-29 NOTE — Progress Notes (Signed)
[name] PA notified on 2nd attempt re: temp 102 - after IS,C&DB. Tylenol ordered and to monitor pt. Christopher [name]

## 2011-08-29 NOTE — Progress Notes (Signed)
Physical Therapy Evaluation Patient Details Name: Christopher [name] MRN: [id number] DOB: 12/13/1960 Today's Date: 08/29/2011  Problem List:  Patient Active Problem List  Diagnoses  . AODM  . WOUND INFECTION    Past Medical History:  Past Medical History  Diagnosis Date  . Hypertension   . Diabetes mellitus   . Blood transfusion   . Arthritis   . Hearing aid worn     L ear  . Wears glasses    Past Surgical History:  Past Surgical History  Procedure Date  . Cervical discectomy     with grafting/plate  . Ulnar nerve repair     decompression, bilat  . Carpal tunnel release     bilat  . Tendon repair     R hand  . Tonsillectomy   . Back surgery     5-6 back surgeries, surgery 2010- ended /w infection at wound, treatment with antibiotics via PICC line     PT Assessment/Plan/Recommendation PT Assessment Clinical Impression Statement: Pt. appears to have good control of pain allowing better than expected mobility 1st time up.  Pt. has overall decreases in functional mobility, gait and will need acute and possibly HHPT to reach independence.  Also recommend OT evaluation for ADLs PT Recommendation/Assessment: Patient will need skilled PT in the acute care venue PT Problem List: Decreased activity tolerance;Decreased mobility;Decreased knowledge of use of DME;Decreased knowledge of precautions;Pain Barriers to Discharge: None PT Therapy Diagnosis : Difficulty walking;Acute pain PT Plan PT Frequency: Min 6X/week PT Treatment/Interventions: DME instruction;Gait training;Functional mobility training;Therapeutic activities;Patient/family education PT Recommendation Recommendations for Other Services: OT consult Follow Up Recommendations: Home health PT Equipment Recommended: None recommended by PT (pt has in home) PT Goals  Acute Rehab PT Goals PT Goal Formulation: With patient Time For Goal Achievement: 7 days Pt will go Supine/Side to Sit: with modified independence PT  Goal: Supine/Side to Sit - Progress: Progressing toward goal Pt will go Sit to Supine/Side: with modified independence PT Goal: Sit to Supine/Side - Progress: Not met Pt will go Sit to Stand: with modified independence PT Goal: Sit to Stand - Progress: Progressing toward goal Pt will go Stand to Sit: with modified independence PT Goal: Stand to Sit - Progress: Progressing toward goal Pt will Ambulate: 51 - 150 feet;with modified independence;with rolling walker PT Goal: Ambulate - Progress: Progressing toward goal Additional Goals Additional Goal #1: Pt. will state and adhere to 3/3 back precautions and log rolling technique  PT Evaluation Precautions/Restrictions  Precautions Precautions: Back Precaution Booklet Issued: Yes (comment) Precaution Comments: discussed back precautions and provided handout Required Braces or Orthoses: No Prior Functioning  Home Living Lives With: Spouse;Daughter (50 year old) Receives Help From: Family Type of Home: House Home Layout: One level Home Access: Ramped entrance Bathroom Shower/Tub: Tub/shower unit;Curtain Bathroom Toilet: Standard Home Adaptive Equipment: Bedside commode/3-in-1;Walker - rolling;Wheelchair - manual Prior Function Level of Independence: Independent with transfers;Independent with gait (reports he needed assis with dressing PTA) Driving: Yes (occasionally, locally) Vocation: On disability Cognition Cognition Arousal/Alertness: Awake/alert Overall Cognitive Status: Appears within functional limits for tasks assessed Orientation Level: Oriented X4 Sensation/Coordination Sensation Light Touch:  (lower legs and feet; denies numbness and tingling) Extremity Assessment   Mobility (including Balance) Bed Mobility Bed Mobility: Yes Rolling Right: 3: Mod assist Rolling Right Details (indicate cue type and reason): vc's for log rolling technique Right Sidelying to Sit: 3: Mod assist Right Sidelying to Sit Details (indicate  cue type and reason): mod assist and cues to usse  upper body to rise to sit Sit to Supine - Left: Not tested (comment) Transfers Transfers: Yes Sit to Stand: 1: +2 Total assist;Patient percentage (comment);From elevated surface;From bed (pt. 70%) Sit to Stand Details (indicate cue type and reason): cues for pushing from bed and for erect spine Stand to Sit: 3: Mod assist;To chair/3-in-1;With upper extremity assist Stand to Sit Details: cues for safe technique and erect spine Ambulation/Gait Ambulation/Gait: Yes Ambulation/Gait Assistance: 4: Min assist (second person for equipment) Ambulation/Gait Assistance Details (indicate cue type and reason): frequent cues to stand erect and for RW technique Ambulation Distance (Feet): 35 Feet Assistive device: Rolling walker Gait Pattern: Step-through pattern;Decreased step length - right;Decreased step length - left;Trunk flexed Stairs: No    Exercise    End of Session PT - End of Session Equipment Utilized During Treatment: Gait belt Activity Tolerance: Patient tolerated treatment well Patient left: in chair;with call bell in reach (pt. educated to sit short duration of < 1 hour) Nurse Communication: Mobility status for transfers;Mobility status for ambulation General Behavior During Session: [facility] for tasks performed Cognition: [facility] for tasks performed  Ferman Hamming 08/29/2011, 12:14 PM Acute Rehabilitation Services [phone] (pager)

## 2011-08-29 NOTE — Progress Notes (Signed)
  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  I discussed the possibility of having Dr. Shon Baton consult on him to consider the possibility of a minimally invasive lateral approach interbody fusion at L3-4. Carter Kaman,S Aminta Sakurai 08/29/2011, 8:13 AM

## 2011-08-29 NOTE — H&P (Signed)
Christopher [name], Christopher [name]              ACCOUNT NO.:  [id number]  MEDICAL RECORD NO.:  [id number]  LOCATION:  5006                         FACILITY:  [facility]  PHYSICIAN:  Nelda Severe, MD      DATE OF BIRTH:  1961/04/22  DATE OF ADMISSION:  08/28/2011 DATE OF DISCHARGE:                             HISTORY & PHYSICAL   CHIEF COMPLAINT:  Severe back pain associated with spinal instrumentation and fusion fracture.  HISTORY OF PRESENT ILLNESS:  This man has had multiple lumbar spine operations and I am very well acquainted with him.  He presented to the office about a week ago with a history of the acute onset of worsening pain, on  chronic pain background.  He had presented to the emergency room, as I recall in [facility], where radiographs and a CT scan were performed, which demonstrate a broken rod and a fracture through his fusion mass, which is very unusual.  He was seen by me last week in the office where these findings were confirmed.  I explained to him and his wife, who accompanied him at that time, that we really had nothing else to offer him, but repair of the pseudoarthrosis.  At that point in time, his wife was extremely distraught/dissatisfied and to this end, we recommended a second opinion.  In the meantime, he and his wife rethought the matter and think his pain is severe enough that he needs to have something done sooner rather than later.  Therefore, we arranged for surgery today and I have seen him, talked to him, and reexamined him in the preoperative area at this time.  This note is being dictated immediately prior to his proposed surgery.  PHYSICAL EXAMINATION:  GENERAL:  On examination, he is reasonably comfortable with analgesics on board.  He is semi-reclined on a gurney. He is morbidly obese. HEAD, EARS, EYES, NOSE, AND THROAT:  Clear. CHEST:  Normal breath sounds, gynecomastia presumed secondary to obesity. CARDIOVASCULAR:  Rhythm regular, normal  heart sounds, no carotid bruits. No peripheral edema. ABDOMEN:  Obese, nontender. GENITOURINARY:  Not examined. EXTREMITIES/MUSCULOSKELETAL/NEUROLOGICAL:  He has decreased sensation to light touch in both upper and lower extremities on the left side only. There is no weakness noted on manual resistance testing on either of lower extremity.  Ankle jerks and knee jerks were not elicitable.  IMAGING STUDIES:  We had the opportunity to see his previous CT scan done within the last couple of weeks.  It indeed reveals a fracture through the fusion mass, something which is extremely unusual.  We have documented evidence on prior CT scan that the fusion has healed.  At this time, the pseudoarthrosis appears to be at L3-4.  We will revise his fixed fixation, which is very extensive around the L3-4 level, and I have explained to him that this will involve "splicing" an intermediate rod to the existing rods.  We will inspect the contralateral rod, which is not fractured and if there is no break evident, we will not alter that fixation.  General complications were discussed as follows:  Death, heart attack, stroke, __________ pelvic veins, pulmonary embolism, pneumonia, excessive bleeding requiring transfusion, infection.  The procedure risks  have been discussed including neurologic injury, spinal fluid leak, failure of the fusion to heal, the need for more surgery.  He is well acquainted with __________ possible complications, given the number of surgery, he has been through.     Nelda Severe, MD     MT/MEDQ  D:  08/28/2011  T:  08/29/2011  Job:  [id number]

## 2011-08-30 LAB — GLUCOSE, CAPILLARY
Glucose-Capillary: 109 mg/dL — ABNORMAL HIGH (ref 70–99)
Glucose-Capillary: 119 mg/dL — ABNORMAL HIGH (ref 70–99)
Glucose-Capillary: 76 mg/dL (ref 70–99)
Glucose-Capillary: 92 mg/dL (ref 70–99)

## 2011-08-30 MED ORDER — HOME MED STORE IN PYXIS: 1.0000 | Freq: Two times a day (BID) | Status: DC | PRN

## 2011-08-30 MED ORDER — HOME MED STORE IN PYXIS
1.0000 | Freq: Three times a day (TID) | Status: DC
  Administered 2011-08-30 – 2011-09-02 (×9): 1 via ORAL

## 2011-08-30 MED ORDER — TAPENTADOL HCL 50 MG PO TABS: 100.0000 mg | ORAL_TABLET | Freq: Two times a day (BID) | ORAL | Status: DC

## 2011-08-30 MED ORDER — TAPENTADOL HCL 50 MG PO TABS: 200.0000 mg | ORAL_TABLET | Freq: Three times a day (TID) | ORAL | Status: DC

## 2011-08-30 NOTE — Progress Notes (Signed)
Physical Therapy Treatment Patient Details Name: Christopher [name] MRN: [id number] DOB: Jan 11, 1961 Today's Date: 08/30/2011  PT Assessment/Plan  PT - Assessment/Plan Comments on Treatment Session: Pt progressing with PT goals in today's session.   PT Frequency: Min 6X/week Follow Up Recommendations: Home health PT Equipment Recommended: None recommended by PT PT Goals  Acute Rehab PT Goals PT Goal: Supine/Side to Sit - Progress: Progressing toward goal PT Goal: Sit to Stand - Progress: Progressing toward goal PT Goal: Stand to Sit - Progress: Progressing toward goal PT Goal: Ambulate - Progress: Progressing toward goal  PT Treatment Precautions/Restrictions  Precautions Precautions: Back Precaution Booklet Issued: Yes (comment) Precaution Comments: discussed back precautions and provided handout Required Braces or Orthoses: No Mobility (including Balance) Bed Mobility Rolling Left: 5: Supervision;With rail Rolling Left Details (indicate cue type and reason): Cues to reinforce logrolling technique & back precautions.   Left Sidelying to Sit: 5: Supervision;HOB flat;With rails Left Sidelying to Sit Details (indicate cue type and reason): Cues to reinforce logrolling technique & back precautions.   Sitting - Scoot to [place] of Bed: 4: Min assist Sitting - Scoot to [place] of Bed Details (indicate cue type and reason): (A) with use of draw pad to bring hips closer to EOB to prepare for sit>stand transfer.  cues for technique & sequencing.   Transfers Sit to Stand: Other (comment);With upper extremity assist;From bed (Min Guard (A)) Sit to Stand Details (indicate cue type and reason): cues for hand placement Stand to Sit: To chair/3-in-1;With upper extremity assist;With armrests;Other (comment) (Min Guard (A)) Stand to Sit Details: Cues for hand placement & decrease trunk flexion Ambulation/Gait Ambulation/Gait Assistance: Other (comment) (Min Guard (A)) Ambulation/Gait Assistance  Details (indicate cue type and reason): Cues for increased upright posture Ambulation Distance (Feet): 200 Feet Assistive device: Rolling walker Gait Pattern: Step-through pattern;Decreased stride length;Trunk flexed Stairs: No Wheelchair Mobility Wheelchair Mobility: No    Exercise    End of Session PT - End of Session Equipment Utilized During Treatment: Gait belt Activity Tolerance: Patient tolerated treatment well Patient left: in chair General Behavior During Session: [facility] for tasks performed Cognition: [facility] for tasks performed  Lara Mulch 08/30/2011, 2:40 PM [phone]

## 2011-08-30 NOTE — Progress Notes (Signed)
Patient ID: Christopher [name], male   DOB: 05-12-1961, 50 y.o.   MRN: [id number] Procedure(s) (LRB): POSTERIOR LUMBAR FUSION 1 WITH HARDWARE REMOVAL (N/A) 2 Days Post-Op   Subjective: Patient reports pain as marked.  His back feels more stable since the surgery.  Objective:   VITALS:  @VITALSIGNS @  Neurovascular intact Sensation intact distally Dorsiflexion/Plantar flexion intact   LABS Lab Results  Component Value Date   HGB 13.3 08/28/2011   HGB 8.7* 01/03/2010   HGB 10.1* 01/02/2010   Lab Results  Component Value Date   WBC 6.8 08/28/2011   Lab Results  Component Value Date   INR 1.17 08/28/2011   Lab Results  Component Value Date   NA 139 08/28/2011   K 4.2 08/28/2011   CL 100 08/28/2011   CO2 27 08/28/2011   BUN 17 08/28/2011   CREATININE 1.18 08/28/2011   GLUCOSE 89 08/28/2011     Assessment/Plan: Active Problems:  * No active hospital problems. *  Give home meds. nucynta 200mg  er TID for pharmacy to dose.  Cont. With PCA today.  Tomorrow we wll D/C PCA and give 75mg  Nucynta for breakthrough pain.  D/C drains today and placed a C/D dressing.  Up with therapy   Christopher [name] 08/30/2011, 12:16 PM

## 2011-08-31 LAB — CBC
HCT: 32.1 % — ABNORMAL LOW (ref 39.0–52.0)
MCH: 30 pg (ref 26.0–34.0)
MCHC: 33.3 g/dL (ref 30.0–36.0)
MCV: 89.9 fL (ref 78.0–100.0)
Platelets: 212 10*3/uL (ref 150–400)
RDW: 12.9 % (ref 11.5–15.5)

## 2011-08-31 LAB — BASIC METABOLIC PANEL
BUN: 17 mg/dL (ref 6–23)
Calcium: 8.6 mg/dL (ref 8.4–10.5)
Creatinine, Ser: 1.27 mg/dL (ref 0.50–1.35)
GFR calc Af Amer: 75 mL/min — ABNORMAL LOW (ref >90)

## 2011-08-31 LAB — GLUCOSE, CAPILLARY: Glucose-Capillary: 101 mg/dL — ABNORMAL HIGH (ref 70–99)

## 2011-08-31 MED ORDER — POLYVINYL ALCOHOL 1.4 % OP SOLN
1.0000 [drp] | OPHTHALMIC | Status: DC | PRN
  Filled 2011-08-31: qty 15

## 2011-08-31 MED ORDER — HOME MED STORE IN PYXIS
1.0000 | Freq: Every day | Status: DC | PRN
  Administered 2011-08-31 – 2011-09-02 (×6): 1 via ORAL

## 2011-08-31 NOTE — Progress Notes (Signed)
Patient ID: Christopher [name], male   DOB: Apr 16, 1961, 50 y.o.   MRN: [id number] Procedure(s) (LRB): POSTERIOR LUMBAR FUSION 1 WITH HARDWARE REMOVAL (N/A) 3 Days Post-Op   Subjective: Patient reports pain as marked.  His back feels more stable and he stands up taller and more comfortably.  Objective:   VITALS:  @VITALSIGNS @  Neurovascular intact Sensation intact distally Dorsiflexion/Plantar flexion intact   LABS Lab Results  Component Value Date   HGB 13.3 08/28/2011   HGB 8.7* 01/03/2010   HGB 10.1* 01/02/2010   Lab Results  Component Value Date   WBC 6.8 08/28/2011   Lab Results  Component Value Date   INR 1.17 08/28/2011   Lab Results  Component Value Date   NA 139 08/28/2011   K 4.2 08/28/2011   CL 100 08/28/2011   CO2 27 08/28/2011   BUN 17 08/28/2011   CREATININE 1.18 08/28/2011   GLUCOSE 89 08/28/2011     Assessment/Plan: Active Problems:  * No active hospital problems. *    Advance diet Up with therapy D/C IV fluids   Christopher [name] 08/31/2011, 10:05 AM

## 2011-08-31 NOTE — Progress Notes (Signed)
Fentanyl 10 mcg/ml PCA D/C'd and syringe amt wasted in trash -35 ml/350 mg. Witnessed by Arnoldo Morale, RN. Debria Garret

## 2011-08-31 NOTE — Progress Notes (Signed)
Utilization review completed. Roselia Snipe, RN, BSN. 08/31/11  

## 2011-08-31 NOTE — Progress Notes (Signed)
Physical Therapy Treatment Patient Details Name: LYNK MARTI MRN: [id number] DOB: 02-15-1961 Today's Date: 08/31/2011  PT Assessment/Plan  PT - Assessment/Plan Comments on Treatment Session: Pt continues to make good progress towards goals. PT Plan: Discharge plan remains appropriate PT Frequency: Min 6X/week Follow Up Recommendations: Home health PT Equipment Recommended: None recommended by PT PT Goals  Acute Rehab PT Goals PT Goal: Supine/Side to Sit - Progress: Met PT Goal: Sit to Supine/Side - Progress: Progressing toward goal PT Goal: Sit to Stand - Progress: Progressing toward goal PT Goal: Stand to Sit - Progress: Progressing toward goal PT Goal: Ambulate - Progress: Progressing toward goal  PT Treatment Precautions/Restrictions  Precautions Precautions: Back Precaution Booklet Issued: Yes (comment) Precaution Comments: Pt able to recall all back precautions without cueing Required Braces or Orthoses: No Restrictions Weight Bearing Restrictions: No Mobility (including Balance) Bed Mobility Rolling Left: 6: Modified independent (Device/Increase time) Left Sidelying to Sit: 6: Modified independent (Device/Increase time) Sitting - Scoot to Edge of Bed: 5: Supervision Sitting - Scoot to [place] of Bed Details (indicate cue type and reason): Cues not to bend at back with scooting forward. Cues to pivot hips Transfers Sit to Stand: 5: Supervision;From bed;With upper extremity assist Sit to Stand Details (indicate cue type and reason): Cues for posture Stand to Sit: 5: Supervision;With upper extremity assist;To chair/3-in-1 Ambulation/Gait Ambulation/Gait Assistance: 5: Supervision Ambulation/Gait Assistance Details (indicate cue type and reason): Cues for positioning inside of RW and to stand upright. Cues to increase step length Ambulation Distance (Feet): 260 Feet Assistive device: Rolling walker Gait Pattern: Trunk flexed;Decreased stride length Gait velocity:  decreased.     Exercise    End of Session PT - End of Session Equipment Utilized During Treatment: Gait belt Activity Tolerance: Patient tolerated treatment well Patient left: in chair;with call bell in reach General Behavior During Session: [facility] for tasks performed Cognition: [facility] for tasks performed  Fredrich Birks 08/31/2011, 12:44 PM 08/31/2011 Fredrich Birks PTA [phone] pager [phone] office

## 2011-08-31 NOTE — Progress Notes (Signed)
CARE MANAGEMENT NOTE 08/31/2011 Discharge Planning. Faxed information and orders for home health to [employer] comp carrier- [phone]. The offfice is closed until Wednesday Jan 2,2013. Will follow up then.

## 2011-09-01 LAB — GLUCOSE, CAPILLARY
Glucose-Capillary: 96 mg/dL (ref 70–99)
Glucose-Capillary: 84 mg/dL (ref 70–99)
Glucose-Capillary: 113 mg/dL — ABNORMAL HIGH (ref 70–99)

## 2011-09-01 NOTE — Consult Note (Signed)
Christopher Sax, MD, MD Chief Complaint: Back pain, poor posture History: injury.This is a very pleasant gentleman who has had multiple lumbar spinal operations as a result of a work related injury.  Most recently approximately 2 weeks ago he had a rare malfunction. The fracture of his wrist or lateral bone fusion, as well as rod fracture at the L3-4 level. As a result, Dr. Alveda Reasons brought the patient back to the operating room. He reexplored the pseudarthrosis and placed a bone graft, and OP-1. In addition he extended his fusion at T3 level.  Currently the patient is recovering from this operation in the hospital. Does of the on construct failure at L3-4 , Dr. Alveda Reasons felt as though interbody fixation may be warranted.  As a result, he asked me to evaluate the patient to determine whether or not a lateral L3-4 instrumented fusion would be warranted     Past Medical History  Diagnosis Date  . Hypertension   . Diabetes mellitus   . Blood transfusion   . Arthritis   . Hearing aid worn     L ear  . Wears glasses     Allergies  Allergen Reactions  . Adhesive (Tape) Hives  . Allopurinol Other (See Comments)    Gi bleed  . Amitriptyline Other (See Comments)    Diff. breathing  . Aspirin Other (See Comments)    Gi bleed  . Codeine Other (See Comments)    Breathing problems.  . Dilaudid (Hydromorphone Hcl) Other (See Comments)    Breathing problems.  . Fentanyl Other (See Comments)    Breathing difficulties.  . Flexeril (Cyclobenzaprine Hcl) Other (See Comments)    Gi upset.  Marland Kitchen Hydrocodone Other (See Comments)    Gi upset.  . Ibuprofen Other (See Comments)    Stomach problems.  . Mobic Other (See Comments)    Diff. Breathing   . Morphine And Related Other (See Comments)    Stomach problems.  . Rifampin     REACTION: nausea, vomiting  . Tramadol Other (See Comments)    Diff. breathing  . Vicodin (Hydrocodone-Acetaminophen) Other (See Comments)    Gi upset.  . Celecoxib Rash  .  Penicillins Rash    No current facility-administered medications on file prior to encounter.   Current Outpatient Prescriptions on File Prior to Encounter  Medication Sig Dispense Refill  . diazepam (VALIUM) 5 MG tablet Take 5 mg by mouth every 8 (eight) hours as needed. For anxiety.      Marland Kitchen lisinopril (PRINIVIL,ZESTRIL) 10 MG tablet Take 10 mg by mouth daily.        . magnesium oxide (MAG-OX) 400 MG tablet Take 400 mg by mouth daily.        . meperidine (DEMEROL) 50 MG tablet Take 50 mg by mouth 3 (three) times daily as needed. For breakthrough pain.      . metFORMIN (GLUCOPHAGE) 500 MG tablet Take 500 mg by mouth daily.          Physical Exam: Filed Vitals:   09/01/11 0553  BP: 119/65  Pulse: 105  Temp: 100.1 F (37.8 C)  Resp: 18   No shortness of breath or chest pain.  Abdomen is soft and nontender. No rebound tenderness.  Thoracal lumbar spinal incision is clean dry and intact there is no drainage. No erythema.  He has back tenderness diffusely to direct palpation.  He is able to stand and ambulate short distances with a walker. Babinski is negative. No clonus. He has diffuse  lower extremity weakness which is more of a sign of pain inhibition. He is grossly neurologically intact. Sensation to light touch is diffusely intact in the lower extremity.  Compartments are soft and nontender. 2+ dorsalis pedis posterior tibialis pulses.  He is alert and orientated x3 Cranial nerves II through XII were testament grossly intact.  Vital signs are stable.  Image: Dg Lumbar Spine Complete  08/28/2011  *RADIOLOGY REPORT*  Clinical Data: Posterior lumbar fusion.  LUMBAR SPINE - COMPLETE 4+ VIEW  Comparison: 06/16/2010  Findings: Posterolateral rod and pedicle screw apparatus noted extending from the sacrum to the T10 level and above, with pedicle screws at each visualized level with the exception of T11 and on the left at L3. Posterolateral fusion noted extending at least between  L2 and S1 and most likely above this level, although overpenetration causes poor visualization of the lower thoracic spine.  The right thoracic and upper lumbar rod appears to terminate at the L2-3 level, with a 4 mm gap to the lumbar right sided rod.  This gap is bridged by a secondary rod on the right side which attaches to the thoracic / upper lumbar right sided rod at the L1-2 level and to a right iliac bone screw in the pelvis.  No definite abnormal lucency around the pedicle screws noted to suggest loosening/infection. On the lateral projection, there is subtle transverse lucency in the right lumbar primary rod (attached to the pedicle screws) and possibly through the fusion mass, potentially reflecting nondisplaced fracture of the right rod and fusion mass - correlate with any cross-sectional imaging  IMPRESSION:  1.  Posterolateral rod pedicle screw fixation extends from the sacrum up through the lower thoracic spine. Possible nondisplaced fracture of the primary right lumbar rod at the L4-5 level, potentially with a fracture through the bony fusion mass posterolaterally.  Original Report Authenticated By: Christopher [name], M.D.    A/P: This is a very pleasant gentleman whose had multiple lumbar spinal future surgeries in the past. He's had an anterior cervical C4-C6 instrumented fusion, L5-S1 lumbar decompression, transforaminal lumbar interbody fusion L5-S1, posterior thoracolumbar spinal fusion, I&D of wound infection, fracture of his posterior instrumented construct with most recently T3-S1 instrumented fusion.  Because of the recent pseudoarthrosis and hardware failure L3-4, Dr. Adriana Simas asked me to evaluate the patient in consultation a to determine whether a lateral interbody fusion at L3-4 would be warranted.  Had the opportunity to review the patient's medical history, CT scans, x-rays, and evaluate the patient clinically. Patient is obviously morbidly obese which poses significant technical  complications to a lateral interbody fusion in addition the patient reports that for the first time since his work-related injury in the year 2009 and his index lumbar spine that he no longer has radicular leg pain. His only significant complaint at this time is moderate to severe back pain without radiation to the lower extremity.  I am not confident that even prior were obtained a solid L3-4 anterior inter vertebral fusion that his quality of life would significantly improve I would be quite concerned that the L4-5 disc level could become symptomatic since he has already has at L5-S1 intervertebral fusion.  Therefore in order to try adequate anterior column support I would need to do both the L3-4, and 4-5 levels. And again from a technical standpoint the L4-5 be very difficult.  Given the fact that he actually has improved lower extremity symptoms, is only 1 week out from the posterior thoracolumbar fusion, and for  the reason stated above I do not think at this time oral interbody fusion is advisable.  I appreciate the opportunity to provide this consultation for Dr. Alveda Reasons.  If there's any other questions concerns or issues please not hesitate to contact me  I will defer further treatment options to Dr. Alveda Reasons.

## 2011-09-01 NOTE — Progress Notes (Signed)
  Patient Details:  Christopher [name] 27-May-1961 [id number]  Dr. Shon Baton consultation much appreciated.    VS's stable,  mildlyfebrile - 38.1  Says he feels less pain than he has in several years  O/E  Dressing dry, moves LE's well  Asse't  Doing much better than I had anticipated.  Will plan on discharging tomorrow, provided he does not spike a fever above current baseline  Kenny Stern,S Ruhama Lehew 09/01/2011, 11:08 AM

## 2011-09-01 NOTE — Progress Notes (Signed)
Physical Therapy Treatment Patient Details Name: Christopher [name] MRN: [id number] DOB: 05-Jul-1961 Today's Date: 09/01/2011  PT Assessment/Plan  PT - Assessment/Plan Comments on Treatment Session: Doing well with mobility but focus of session today was reinforcing proper technique & posture with ambulation.   PT Plan: Discharge plan remains appropriate PT Frequency: Min 6X/week Follow Up Recommendations: Home health PT Equipment Recommended: None recommended by PT PT Goals  Acute Rehab PT Goals PT Goal: Sit to Stand - Progress: Met PT Goal: Stand to Sit - Progress: Met PT Goal: Ambulate - Progress: Progressing toward goal  PT Treatment Precautions/Restrictions  Precautions Precautions: Back Precaution Booklet Issued: Yes (comment) Precaution Comments: Pt able to recall all back precautions without cueing Required Braces or Orthoses: No Restrictions Weight Bearing Restrictions: No Mobility (including Balance) Bed Mobility Bed Mobility: No Transfers Sit to Stand: 6: Modified independent (Device/Increase time);From chair/3-in-1;With upper extremity assist;With armrests Stand to Sit: 6: Modified independent (Device/Increase time);To chair/3-in-1;With upper extremity assist;With armrests Ambulation/Gait Ambulation/Gait Assistance: 5: Supervision Ambulation/Gait Assistance Details (indicate cue type and reason): Cues to reinforce hip extension to promote upright posture, increased step/stride length, decrease reliance of UE's on RW, increase gait speed if tolerable.  Ambulation Distance (Feet): 300 Feet Assistive device: Rolling walker Gait Pattern: Trunk flexed;Decreased step length - right;Decreased step length - left;Decreased stride length Stairs: No Wheelchair Mobility Wheelchair Mobility: No    Exercise    End of Session PT - End of Session Equipment Utilized During Treatment: Gait belt Activity Tolerance: Patient tolerated treatment well Patient left: in chair;with call  bell in reach General Behavior During Session: [facility] for tasks performed Cognition: [facility] for tasks performed  Lara Mulch 09/01/2011, 1:47 PM [phone]

## 2011-09-02 LAB — GLUCOSE, CAPILLARY: Glucose-Capillary: 89 mg/dL (ref 70–99)

## 2011-09-02 MED ORDER — HOME MED STORE IN PYXIS: 1.0000 | Freq: Every day | Status: AC | PRN

## 2011-09-02 MED ORDER — HOME MED STORE IN PYXIS: 1.0000 | Freq: Three times a day (TID) | Status: AC

## 2011-09-02 NOTE — Progress Notes (Signed)
  Patient Details:  Christopher [name] March 29, 1961 [id number]  Afebrile, vital signs stable  He is ready to be discharged  O/E comfortable,  Moves well   Assessment good progress,   Plan  Discharge, follow up in Feb1.  Roselynn Whitacre,S Kenshin Splawn 09/02/2011, 7:53 AM

## 2011-09-02 NOTE — Discharge Summary (Signed)
Physician Discharge Summary  Patient ID: Christopher [name] MRN: [id number] DOB/AGE: August 18, 1961 51 y.o.  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 *    Discharge Diagnoses:  Same  Past Medical History  Diagnosis Date  . Hypertension   . Diabetes mellitus   . Blood transfusion   . Arthritis   . Hearing aid worn     L ear  . Wears glasses     Surgeries: Procedure(s): POSTERIOR LUMBAR FUSION 1 WITH HARDWARE REMOVAL on 08/28/2011   Discharged Condition: Improved  Hospital Course: Christopher [name] is an 51 y.o. male who was admitted 08/28/2011 with a diagnosis of <principal problem not specified> and went to the operating room on 08/28/2011 and underwent revision of lumbar fusion and fixation. He improved postoperatively in terms of pain.  At the time of discharge the wound is stable and he is ambulatory with a walker.  He was seen in consultation by Dr. Venita Lick, orhtopaedic spine surgeon, at my request, regarding the advisability of augmenting his posterior fusion with a minimally invasive interbody fusion at L3-4.  Dr. Shon Baton thought this was not advisable.  The patient will return for follow-up on 1 Feb., 2013. Anti-infectives     Start     Dose/Rate Route Frequency Ordered Stop   08/29/11 0200   vancomycin (VANCOCIN) IVPB 1000 mg/200 mL premix        1,000 mg 200 mL/hr over 60 Minutes Intravenous Every 12 hours 08/28/11 2216 08/29/11 0309   08/28/11 1415   vancomycin (VANCOCIN) powder 1,000 mg        1,000 mg Other To Surgery 08/28/11 1407 08/28/11 1741   08/28/11 1405   polymyxin B 500,000 Units, bacitracin 50,000 Units in sodium chloride irrigation 0.9 % 500 mL irrigation  Status:  Discontinued          As needed 08/28/11 1405 08/28/11 1855   08/28/11 1000   vancomycin (VANCOCIN) IVPB 1000 mg/200 mL premix        1,000 mg 200 mL/hr over 60  Minutes Intravenous 60 min pre-op 08/28/11 0949 08/28/11 1315        .  He  were given sequential compression devices, early ambulation, and mechanical prophylaxis for DVT prophylaxis.  They benefited maximally from their hospital stay and there were no complications.    Recent vital signs:  Filed Vitals:   09/02/11 0557  BP: 97/41  Pulse: 90  Temp: 98.5 F (36.9 C)  Resp: 18    Recent laboratory studies:  Lab Results  Component Value Date   HGB 10.7* 08/31/2011   HGB 13.3 08/28/2011   HGB 8.7* 01/03/2010   Lab Results  Component Value Date   WBC 11.6* 08/31/2011   PLT 212 08/31/2011   Lab Results  Component Value Date   INR 1.17 08/28/2011   Lab Results  Component Value Date   NA 133* 08/31/2011   K 4.2 08/31/2011   CL 97 08/31/2011   CO2 29 08/31/2011   BUN 17 08/31/2011   CREATININE 1.27 08/31/2011   GLUCOSE 104* 08/31/2011    Discharge Medications:   Current Discharge Medication List    START taking these medications   Details  !! home med stored in pyxis Take 1 each by mouth every 8 (eight) hours. Qty: 120 each, Refills: 0    !! home med stored in pyxis Take 1 each by mouth 5 (five) times  daily as needed (breakthrough pain.). Qty: 120 each, Refills: 0     !! - Potential duplicate medications found. Please discuss with provider.    CONTINUE these medications which have NOT CHANGED   Details  cetirizine (ZYRTEC) 10 MG tablet Take 10 mg by mouth daily.      cholecalciferol (VITAMIN D) 1000 UNITS tablet Take 4,000 Units by mouth daily.      diazepam (VALIUM) 5 MG tablet Take 5 mg by mouth every 8 (eight) hours as needed. For anxiety.    levETIRAcetam (KEPPRA) 250 MG tablet Take 250 mg by mouth 3 (three) times daily.      lisinopril (PRINIVIL,ZESTRIL) 10 MG tablet Take 10 mg by mouth daily.      magnesium oxide (MAG-OX) 400 MG tablet Take 400 mg by mouth daily.      meperidine (DEMEROL) 50 MG tablet Take 50 mg by mouth 3 (three) times daily as  needed. For breakthrough pain.    metFORMIN (GLUCOPHAGE) 500 MG tablet Take 500 mg by mouth daily.      methocarbamol (ROBAXIN) 750 MG tablet Take 750 mg by mouth 3 (three) times daily.      promethazine (PHENERGAN) 25 MG tablet Take 25 mg by mouth every 8 (eight) hours as needed. For nausea.     Tapentadol HCl (NUCYNTA ER) 200 MG TB12 Take 1 tablet by mouth every 8 (eight) hours.      Tapentadol HCl (NUCYNTA) 75 MG TABS Take 1 tablet by mouth 3 (three) times daily as needed. For breakthrough pain       STOP taking these medications     mupirocin nasal ointment (BACTROBAN) 2 %         Diagnostic Studies: Dg Lumbar Spine Complete  08/28/2011  *RADIOLOGY REPORT*  Clinical Data: Posterior lumbar fusion.  LUMBAR SPINE - COMPLETE 4+ VIEW  Comparison: 06/16/2010  Findings: Posterolateral rod and pedicle screw apparatus noted extending from the sacrum to the T10 level and above, with pedicle screws at each visualized level with the exception of T11 and on the left at L3. Posterolateral fusion noted extending at least between L2 and S1 and most likely above this level, although overpenetration causes poor visualization of the lower thoracic spine.  The right thoracic and upper lumbar rod appears to terminate at the L2-3 level, with a 4 mm gap to the lumbar right sided rod.  This gap is bridged by a secondary rod on the right side which attaches to the thoracic / upper lumbar right sided rod at the L1-2 level and to a right iliac bone screw in the pelvis.  No definite abnormal lucency around the pedicle screws noted to suggest loosening/infection. On the lateral projection, there is subtle transverse lucency in the right lumbar primary rod (attached to the pedicle screws) and possibly through the fusion mass, potentially reflecting nondisplaced fracture of the right rod and fusion mass - correlate with any cross-sectional imaging  IMPRESSION:  1.  Posterolateral rod pedicle screw fixation extends from  the sacrum up through the lower thoracic spine. Possible nondisplaced fracture of the primary right lumbar rod at the L4-5 level, potentially with a fracture through the bony fusion mass posterolaterally.  Original Report Authenticated By: Dellia Cloud, M.D.    Disposition:   Discharge Orders    Future Orders Please Complete By Expires   Diet - low sodium heart healthy      Call MD / Call 911      Comments:   If  you experience chest pain or shortness of breath, CALL 911 and be transported to the hospital emergency room.  If you develope a fever above 101 F, pus (white drainage) or increased drainage or redness at the wound, or calf pain, call your surgeon's office.   Discharge instructions      Comments:   No bending, stooping, or lifting.  Walk for exercise.  Call for persistent fever or wound drainage.   Constipation Prevention      Comments:   Drink plenty of fluids.  Prune juice may be helpful.  You may use a stool softener, such as Colace (over the counter) 100 mg twice a day.  Use MiraLax (over the counter) for constipation as needed.   Increase activity slowly as tolerated      Weight Bearing as taught in Physical Therapy      Comments:   Use a walker or crutches as instructed.   Patient may shower      Comments:   You may shower without a dressing once there is no drainage.  Do not wash over the wound.  If drainage remains, cover wound with plastic wrap and then shower.   Lifting restrictions      Comments:   Ten pounds maximum waist level only         Signed: Nichollas Perusse,S Sophiya Morello 09/02/2011, 7:44 AM

## 2011-09-02 NOTE — Progress Notes (Signed)
Physical Therapy Treatment Patient Details Name: RAHEEM KOLBE MRN: [id number] DOB: 02-Jul-1961 Today's Date: 09/02/2011  PT Assessment/Plan  PT - Assessment/Plan Comments on Treatment Session: Continued to focus on proper posture with ambulation and decreased reliance on RW.  PT Plan: Discharge plan remains appropriate PT Frequency: Min 6X/week Follow Up Recommendations: Home health PT Equipment Recommended: None recommended by PT PT Goals  Acute Rehab PT Goals PT Goal: Sit to Stand - Progress: Met PT Goal: Stand to Sit - Progress: Met PT Goal: Ambulate - Progress: Progressing toward goal Additional Goals Additional Goal #1:  (Pt has met this goal)  PT Treatment Precautions/Restrictions  Precautions Precautions: Back Precaution Booklet Issued: Yes (comment) Precaution Comments: Pt able to recall all back precautions without cueing Required Braces or Orthoses: No Restrictions Weight Bearing Restrictions: No Mobility (including Balance) Transfers Sit to Stand: 6: Modified independent (Device/Increase time) Stand to Sit: 6: Modified independent (Device/Increase time) Ambulation/Gait Ambulation/Gait Assistance: 5: Supervision Ambulation/Gait Assistance Details (indicate cue type and reason): Pt still requiring cues to extend at hips and back to obtain full upright posture. Cues to increase step length. RW height adjusted. Ambulation Distance (Feet): 300 Feet Assistive device: Rolling walker Gait Pattern: Step-to pattern;Decreased stride length;Trunk flexed    Exercise    End of Session PT - End of Session Equipment Utilized During Treatment: Gait belt Activity Tolerance: Patient tolerated treatment well Patient left: in chair;with call bell in reach General Behavior During Session: [facility] for tasks performed Cognition: [facility] for tasks performed  Fredrich Birks 09/02/2011, 10:53 AM 09/02/2011 Fredrich Birks PTA [phone] pager [phone]  office

## 2011-09-07 MED FILL — Heparin Sodium (Porcine) Inj 1000 Unit/ML: INTRAMUSCULAR | Qty: 30 | Status: AC

## 2011-09-07 MED FILL — Sodium Chloride IV Soln 0.9%: INTRAVENOUS | Qty: 1000 | Status: AC

## 2012-03-23 ENCOUNTER — Encounter (HOSPITAL_COMMUNITY): Payer: Self-pay | Admitting: Pharmacy Technician

## 2012-03-30 NOTE — H&P (Signed)
NAME: Christopher [name] MRN: #[id number] DATE: March 30, 2012 DOB: 09/29/1960  HISTORY OF PRESENT ILLNESS:  Christopher [name] attended the office today, accompanied by his parents. He is booked to have surgery, anterior L3-4 fusion, done in 6 days time. 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  He has primarily left-sided low back pain radiating into his buttock and posterior thigh. He has had a CT scan, which I reviewed today with him, which shows what appears to be a fracture in and a posterolateral fusion mass at L3-4 on the left side and probably no fusion on the right side, although to quote the radiologist "this is a subtle finding".  The plan is to perform through a lateral approach an anterior L3-4 interbody fusion utilizing a cage, bone graft, and BMP 7. We will also use a plate and screws at that level. Another possibility would be to revise his posterior fixation, but that just becomes too much surgery for one day and may have to be considered at a later time.  PHYSICAL EXAMINATION:  On examination today he stands stooped and can walk slowly with a cane on the right side. Straight leg raising is from about 20 degrees through 80 degrees bilaterally. Extending the leg fully to have him lay his legs flat table causes a great deal of back pain. There is decreased sensation to light touch through the entire upper and lower extremity on the left side.  His general physical examination was performed today as a prelude to his surgery as follows:  Head, ears, eyes, nose and throat-normal extraocular motion, oropharynx normal, no cervical lymphadenopathy.  Cardiovascular-heart sounds normal, regular rhythm, no carotid bruits, no peripheral edema.  Respiratory-no adventitious breath sounds.  Abdomen-obese, nontender.  Musculoskeletal/neurological-deep tendon reflexes in the lower extremities are 0/4 bilaterally. Decreased sensation to light touch throughout the left lower extremity. No focal  weakness on manual muscle testing in either lower extremity.  STUDIES:  CT scan as noted was reviewed and shows pseudoarthrosis at L3-4. This was shown to Christopher [name] and his parents who accompanied him today.   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  DISCUSSION: The surgery will be as noted. His hospitalization will probably be about 5 days in length. His wife will accompany him to [place].  She will require lodging at a local hotel as there really is no facility for changing, showering, etc. for her use at the hospital.  He will require the same assistance with activities of daily living from his family, at the time of discharge as he has been receiving now. This has been delivered by family members; authorization for payment for this medically necessary service is pending from the insurance company.  MEDICATIONS:  Also not mention above is the fact that his current medication regime is as follows: Nucynta 200 ER t.i.d.; Nucynta IR 75 mg t.i.d. for breakthrough pain; diazepam 5 mg p.o. t.i.d.; Robaxin 750 mg p.o. t.i.d.; lisinopril 10 mg daily, metformin 500 mg at supper; vitamin D 4,000 units daily; Phenergan 25 mg p.r.n. for nausea up to 3 times per day; multivitamin daily.  ALLERGIES:  No allergies.  He remains temporally totally disabled.   Nelda Severe, MD/[id number]  Auto-Authenticated by Nelda Severe, MD  C:  Workers Compensation carrier  NAME: Christopher [name] MRN: #[id number] DATE: March 30, 2012 DOB: Oct 28, 1960  HISTORY OF PRESENT ILLNESS:  Christopher [name] attended the office today, accompanied by his parents. He is booked to have surgery, anterior L3-4 fusion, done in 6 days  time. 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  He has primarily left-sided low back pain radiating into his buttock and posterior thigh. He has had a CT scan, which I reviewed today with him, which shows what appears to be a fracture in and a posterolateral fusion mass at L3-4 on the left side  and probably no fusion on the right side, although to quote the radiologist "this is a subtle finding".  The plan is to perform through a lateral approach an anterior L3-4 interbody fusion utilizing a cage, bone graft, and BMP 7. We will also use a plate and screws at that level. Another possibility would be to revise his posterior fixation, but that just becomes too much surgery for one day and may have to be considered at a later time.  PHYSICAL EXAMINATION:  On examination today he stands stooped and can walk slowly with a cane on the right side. Straight leg raising is from about 20 degrees through 80 degrees bilaterally. Extending the leg fully to have him lay his legs flat table causes a great deal of back pain. There is decreased sensation to light touch through the entire upper and lower extremity on the left side.  His general physical examination was performed today as a prelude to his surgery as follows:  Head, ears, eyes, nose and throat-normal extraocular motion, oropharynx normal, no cervical lymphadenopathy.  Cardiovascular-heart sounds normal, regular rhythm, no carotid bruits, no peripheral edema.  Respiratory-no adventitious breath sounds.  Abdomen-obese, nontender.  Musculoskeletal/neurological-deep tendon reflexes in the lower extremities are 0/4 bilaterally. Decreased sensation to light touch throughout the left lower extremity. No focal weakness on manual muscle testing in either lower extremity.  STUDIES:  CT scan as noted was reviewed and shows pseudoarthrosis at L3-4. This was shown to Christopher [name] and his parents who accompanied him today.   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  DISCUSSION: The surgery will be as noted. His hospitalization will probably be about 5 days in length. His wife will accompany him to [place].  She will require lodging at a local hotel as there really is no facility for changing, showering, etc. for her use at the  hospital.  He will require the same assistance with activities of daily living from his family, at the time of discharge as he has been receiving now. This has been delivered by family members; authorization for payment for this medically necessary service is pending from the insurance company.  MEDICATIONS:  Also not mention above is the fact that his current medication regime is as follows: Nucynta 200 ER t.i.d.; Nucynta IR 75 mg t.i.d. for breakthrough pain; diazepam 5 mg p.o. t.i.d.; Robaxin 750 mg p.o. t.i.d.; lisinopril 10 mg daily, metformin 500 mg at supper; vitamin D 4,000 units daily; Phenergan 25 mg p.r.n. for nausea up to 3 times per day; multivitamin daily.  ALLERGIES:  No allergies.  He remains temporally totally disabled.   Nelda Severe, MD/[id number]  Auto-Authenticated by Nelda Severe, MD  C:  Workers Compensation carrier

## 2012-03-31 ENCOUNTER — Other Ambulatory Visit (HOSPITAL_COMMUNITY): Payer: Worker's Compensation

## 2012-04-01 ENCOUNTER — Encounter (HOSPITAL_COMMUNITY)
Admission: RE | Admit: 2012-04-01 | Discharge: 2012-04-01 | Disposition: A | Discharge status: Home / Self Care | Payer: Worker's Compensation | Source: Ambulatory Visit | Attending: Orthopedic Surgery | Admitting: Orthopedic Surgery

## 2012-04-01 ENCOUNTER — Encounter (HOSPITAL_COMMUNITY): Payer: Self-pay

## 2012-04-01 HISTORY — DX: Myoneural disorder, unspecified: G70.9

## 2012-04-01 LAB — SURGICAL PCR SCREEN
MRSA, PCR: NEGATIVE
Staphylococcus aureus: NEGATIVE

## 2012-04-01 LAB — CBC
HCT: 38.4 % — ABNORMAL LOW (ref 39.0–52.0)
RDW: 14.3 % (ref 11.5–15.5)
WBC: 7.3 10*3/uL (ref 4.0–10.5)

## 2012-04-01 LAB — TYPE AND SCREEN
ABO/RH(D): O POS
Antibody Screen: NEGATIVE

## 2012-04-01 LAB — BASIC METABOLIC PANEL
Chloride: 101 mEq/L (ref 96–112)
Creatinine, Ser: 0.99 mg/dL (ref 0.50–1.35)
GFR calc Af Amer: >90 mL/min (ref >90)
Potassium: 4.2 mEq/L (ref 3.5–5.1)

## 2012-04-01 NOTE — Progress Notes (Signed)
Primary Physician - Dr. Neal Dy with the [place]. Does not have a cardiologist.   EKG - April 2013 Dr. Verne Spurr - will request Echo - April 2013 Dr. Neal Dy - will request  has not had a cath or stress test before.

## 2012-04-01 NOTE — Pre-Procedure Instructions (Signed)
20 ROMONE SHAFF  04/01/2012   Your procedure is scheduled on:  August 6th  Report to [facility] at 0530 AM.  Call this number if you have problems the morning of surgery: [phone]   Remember:   Do not eat food or drink:After Midnight.  Take these medicines the morning of surgery with A SIP OF WATER: valium if needed, nucynta if needed   Do not wear jewelry, make-up or nail polish.  Do not wear lotions, powders, or perfumes.   Do not shave 48 hours prior to surgery. Men may shave face and neck.  Do not bring valuables to the hospital.  Contacts, dentures or bridgework may not be worn into surgery.  Leave suitcase in the car. After surgery it may be brought to your room.  For patients admitted to the hospital, checkout time is 11:00 AM the day of discharge.   Special Instructions: CHG Shower Use Special Wash: 1/2 bottle night before surgery and 1/2 bottle morning of surgery.   Please read over the following fact sheets that you were given: Pain Booklet, Coughing and Deep Breathing, Blood Transfusion Information, MRSA Information and Surgical Site Infection Prevention

## 2012-04-05 ENCOUNTER — Inpatient Hospital Stay (HOSPITAL_COMMUNITY)
Admission: RE | Admit: 2012-04-05 | Discharge: 2012-04-09 | DRG: 460 | Disposition: A | Discharge status: Home / Self Care | Payer: Worker's Compensation | Source: Ambulatory Visit | Attending: Orthopedic Surgery | Admitting: Orthopedic Surgery

## 2012-04-05 ENCOUNTER — Ambulatory Visit (HOSPITAL_COMMUNITY): Payer: Worker's Compensation | Admitting: Certified Registered"

## 2012-04-05 ENCOUNTER — Ambulatory Visit (HOSPITAL_COMMUNITY): Payer: Worker's Compensation

## 2012-04-05 ENCOUNTER — Encounter (HOSPITAL_COMMUNITY): Payer: Self-pay | Admitting: Certified Registered"

## 2012-04-05 ENCOUNTER — Encounter (HOSPITAL_COMMUNITY): Payer: Self-pay | Admitting: *Deleted

## 2012-04-05 ENCOUNTER — Encounter (HOSPITAL_COMMUNITY)
Admission: RE | Disposition: A | Discharge status: Home / Self Care | Payer: Self-pay | Source: Ambulatory Visit | Attending: Orthopedic Surgery

## 2012-04-05 DIAGNOSIS — T4275XA Adverse effect of unspecified antiepileptic and sedative-hypnotic drugs, initial encounter: Secondary | ICD-10-CM | POA: Diagnosis not present

## 2012-04-05 DIAGNOSIS — I10 Essential (primary) hypertension: Secondary | ICD-10-CM | POA: Diagnosis present

## 2012-04-05 DIAGNOSIS — Z01812 Encounter for preprocedural laboratory examination: Secondary | ICD-10-CM

## 2012-04-05 DIAGNOSIS — E669 Obesity, unspecified: Secondary | ICD-10-CM | POA: Diagnosis present

## 2012-04-05 DIAGNOSIS — I9589 Other hypotension: Secondary | ICD-10-CM | POA: Diagnosis not present

## 2012-04-05 DIAGNOSIS — Z981 Arthrodesis status: Secondary | ICD-10-CM

## 2012-04-05 DIAGNOSIS — Z6841 Body Mass Index (BMI) 40.0 and over, adult: Secondary | ICD-10-CM

## 2012-04-05 DIAGNOSIS — IMO0002 Reserved for concepts with insufficient information to code with codable children: Principal | ICD-10-CM | POA: Diagnosis present

## 2012-04-05 DIAGNOSIS — E119 Type 2 diabetes mellitus without complications: Secondary | ICD-10-CM | POA: Diagnosis present

## 2012-04-05 HISTORY — PX: ANTERIOR LUMBAR FUSION: SHX1170

## 2012-04-05 LAB — GLUCOSE, CAPILLARY: Glucose-Capillary: 81 mg/dL (ref 70–99)

## 2012-04-05 SURGERY — ANTERIOR LUMBAR FUSION 1 LEVEL
Anesthesia: General | Site: Flank | Wound class: Clean

## 2012-04-05 MED ORDER — BUPIVACAINE LIPOSOME 1.3 % IJ SUSP
20.0000 mL | INTRAMUSCULAR | Status: AC
  Administered 2012-04-05: 20 mL
  Filled 2012-04-05: qty 20

## 2012-04-05 MED ORDER — HYDROMORPHONE 0.3 MG/ML IV SOLN
INTRAVENOUS | Status: DC
  Administered 2012-04-05: 1.2 mg via INTRAVENOUS
  Administered 2012-04-05: 13:00:00 via INTRAVENOUS
  Administered 2012-04-05: 3.6 mg via INTRAVENOUS

## 2012-04-05 MED ORDER — SENNOSIDES 8.6 MG PO TABS: 1.0000 | ORAL_TABLET | Freq: Every day | ORAL | Status: DC

## 2012-04-05 MED ORDER — HEPARIN SODIUM (PORCINE) 1000 UNIT/ML IJ SOLN
INTRAMUSCULAR | Status: AC
  Filled 2012-04-05: qty 1

## 2012-04-05 MED ORDER — PROMETHAZINE HCL 25 MG/ML IJ SOLN
25.0000 mg | Freq: Four times a day (QID) | INTRAMUSCULAR | Status: AC
  Administered 2012-04-05: 25 mg via INTRAVENOUS
  Filled 2012-04-05 (×7): qty 1

## 2012-04-05 MED ORDER — DIPHENHYDRAMINE HCL 12.5 MG/5ML PO ELIX: 12.5000 mg | ORAL_SOLUTION | Freq: Four times a day (QID) | ORAL | Status: DC | PRN

## 2012-04-05 MED ORDER — FLEET ENEMA 7-19 GM/118ML RE ENEM: 1.0000 | ENEMA | Freq: Once | RECTAL | Status: AC | PRN

## 2012-04-05 MED ORDER — FENTANYL CITRATE 0.05 MG/ML IJ SOLN: 25.0000 ug | INTRAMUSCULAR | Status: DC | PRN

## 2012-04-05 MED ORDER — ROCURONIUM BROMIDE 100 MG/10ML IV SOLN
INTRAVENOUS | Status: DC | PRN
  Administered 2012-04-05: 50 mg via INTRAVENOUS

## 2012-04-05 MED ORDER — DIPHENHYDRAMINE HCL 50 MG/ML IJ SOLN: 12.5000 mg | Freq: Four times a day (QID) | INTRAMUSCULAR | Status: DC | PRN

## 2012-04-05 MED ORDER — THROMBIN 20000 UNITS EX KIT
PACK | OROMUCOSAL | Status: DC | PRN
  Administered 2012-04-05: 09:00:00 via TOPICAL

## 2012-04-05 MED ORDER — NEOSTIGMINE METHYLSULFATE 1 MG/ML IJ SOLN
INTRAMUSCULAR | Status: DC | PRN
  Administered 2012-04-05: 5 mg via INTRAVENOUS

## 2012-04-05 MED ORDER — TAPENTADOL HCL ER 200 MG PO TB12: 1.0000 | ORAL_TABLET | Freq: Three times a day (TID) | ORAL | Status: DC

## 2012-04-05 MED ORDER — NALOXONE HCL 0.4 MG/ML IJ SOLN: 0.4000 mg | INTRAMUSCULAR | Status: DC | PRN

## 2012-04-05 MED ORDER — SODIUM CHLORIDE 0.9 % IV SOLN: 250.0000 mL | INTRAVENOUS | Status: DC

## 2012-04-05 MED ORDER — SENNA 8.6 MG PO TABS
1.0000 | ORAL_TABLET | Freq: Two times a day (BID) | ORAL | Status: DC
  Administered 2012-04-05 – 2012-04-09 (×9): 8.6 mg via ORAL
  Filled 2012-04-05 (×10): qty 1

## 2012-04-05 MED ORDER — VECURONIUM BROMIDE 10 MG IV SOLR
INTRAVENOUS | Status: DC | PRN
  Administered 2012-04-05 (×2): 2 mg via INTRAVENOUS
  Administered 2012-04-05 (×3): 1 mg via INTRAVENOUS
  Administered 2012-04-05 (×2): 2 mg via INTRAVENOUS
  Administered 2012-04-05: 1 mg via INTRAVENOUS

## 2012-04-05 MED ORDER — HYDROMORPHONE 0.3 MG/ML IV SOLN
INTRAVENOUS | Status: AC
  Filled 2012-04-05: qty 25

## 2012-04-05 MED ORDER — SODIUM CHLORIDE 0.9 % IJ SOLN: 9.0000 mL | INTRAMUSCULAR | Status: DC | PRN

## 2012-04-05 MED ORDER — TAPENTADOL HCL ER 200 MG PO TB12
200.0000 mg | ORAL_TABLET | Freq: Three times a day (TID) | ORAL | Status: DC
  Administered 2012-04-05 – 2012-04-06 (×4): 200 mg via ORAL
  Administered 2012-04-07 (×2): via ORAL
  Administered 2012-04-07 – 2012-04-09 (×5): 200 mg via ORAL

## 2012-04-05 MED ORDER — ONDANSETRON HCL 4 MG/2ML IJ SOLN: 4.0000 mg | Freq: Four times a day (QID) | INTRAMUSCULAR | Status: DC | PRN

## 2012-04-05 MED ORDER — METFORMIN HCL 500 MG PO TABS
500.0000 mg | ORAL_TABLET | Freq: Every day | ORAL | Status: DC
  Filled 2012-04-05 (×2): qty 1

## 2012-04-05 MED ORDER — VANCOMYCIN HCL IN DEXTROSE 1-5 GM/200ML-% IV SOLN
INTRAVENOUS | Status: AC
  Filled 2012-04-05: qty 200

## 2012-04-05 MED ORDER — LISINOPRIL 10 MG PO TABS
10.0000 mg | ORAL_TABLET | Freq: Every day | ORAL | Status: DC
  Administered 2012-04-05 – 2012-04-09 (×4): 10 mg via ORAL
  Filled 2012-04-05 (×5): qty 1

## 2012-04-05 MED ORDER — LIDOCAINE HCL (PF) 1 % IJ SOLN
INTRAMUSCULAR | Status: AC
  Filled 2012-04-05: qty 30

## 2012-04-05 MED ORDER — PHENYLEPHRINE HCL 10 MG/ML IJ SOLN
INTRAMUSCULAR | Status: DC | PRN
  Administered 2012-04-05 (×10): 80 ug via INTRAVENOUS

## 2012-04-05 MED ORDER — PHENOL 1.4 % MT LIQD: 1.0000 | OROMUCOSAL | Status: DC | PRN

## 2012-04-05 MED ORDER — EPHEDRINE SULFATE 50 MG/ML IJ SOLN
INTRAMUSCULAR | Status: DC | PRN
  Administered 2012-04-05 (×3): 10 mg via INTRAVENOUS

## 2012-04-05 MED ORDER — SODIUM CHLORIDE 0.9 % IJ SOLN
3.0000 mL | Freq: Two times a day (BID) | INTRAMUSCULAR | Status: DC
  Administered 2012-04-06 – 2012-04-09 (×5): 3 mL via INTRAVENOUS

## 2012-04-05 MED ORDER — PHENYLEPHRINE HCL 10 MG/ML IJ SOLN
10.0000 mg | INTRAVENOUS | Status: DC | PRN
  Administered 2012-04-05: 20 ug/min via INTRAVENOUS

## 2012-04-05 MED ORDER — HYDROMORPHONE 0.3 MG/ML IV SOLN
INTRAVENOUS | Status: DC
  Administered 2012-04-05: 21:00:00 via INTRAVENOUS
  Administered 2012-04-06: 1.92 mg via INTRAVENOUS
  Administered 2012-04-06: 2.52 mg via INTRAVENOUS
  Filled 2012-04-05: qty 25

## 2012-04-05 MED ORDER — LORATADINE 10 MG PO TABS
10.0000 mg | ORAL_TABLET | Freq: Every day | ORAL | Status: DC
  Administered 2012-04-05 – 2012-04-09 (×5): 10 mg via ORAL
  Filled 2012-04-05 (×5): qty 1

## 2012-04-05 MED ORDER — ACETAMINOPHEN 325 MG PO TABS
650.0000 mg | ORAL_TABLET | ORAL | Status: DC | PRN
  Administered 2012-04-06 – 2012-04-08 (×5): 650 mg via ORAL
  Filled 2012-04-05 (×5): qty 2

## 2012-04-05 MED ORDER — PROPOFOL 10 MG/ML IV EMUL
INTRAVENOUS | Status: DC | PRN
  Administered 2012-04-05: 300 mg via INTRAVENOUS

## 2012-04-05 MED ORDER — VITAMIN D3 25 MCG (1000 UNIT) PO TABS
4000.0000 [IU] | ORAL_TABLET | Freq: Every day | ORAL | Status: DC
  Administered 2012-04-06 – 2012-04-09 (×4): 4000 [IU] via ORAL
  Filled 2012-04-05 (×5): qty 4

## 2012-04-05 MED ORDER — TAPENTADOL HCL ER 200 MG PO TB12: 200.0000 mg | ORAL_TABLET | Freq: Three times a day (TID) | ORAL | Status: DC

## 2012-04-05 MED ORDER — ONDANSETRON HCL 4 MG/2ML IJ SOLN
INTRAMUSCULAR | Status: DC | PRN
  Administered 2012-04-05: 4 mg via INTRAVENOUS

## 2012-04-05 MED ORDER — BACITRACIN-NEOMYCIN-POLYMYXIN 400-5-5000 EX OINT
TOPICAL_OINTMENT | CUTANEOUS | Status: DC | PRN
  Administered 2012-04-05: 1 via TOPICAL

## 2012-04-05 MED ORDER — BACITRACIN ZINC 500 UNIT/GM EX OINT
TOPICAL_OINTMENT | CUTANEOUS | Status: AC
  Filled 2012-04-05: qty 15

## 2012-04-05 MED ORDER — 0.9 % SODIUM CHLORIDE (POUR BTL) OPTIME
TOPICAL | Status: DC | PRN
  Administered 2012-04-05: 1000 mL

## 2012-04-05 MED ORDER — METHOCARBAMOL 500 MG PO TABS
500.0000 mg | ORAL_TABLET | Freq: Four times a day (QID) | ORAL | Status: DC | PRN
  Administered 2012-04-05 – 2012-04-09 (×10): 500 mg via ORAL
  Filled 2012-04-05 (×12): qty 1

## 2012-04-05 MED ORDER — ALBUMIN HUMAN 5 % IV SOLN
INTRAVENOUS | Status: DC | PRN
  Administered 2012-04-05: 10:00:00 via INTRAVENOUS

## 2012-04-05 MED ORDER — LACTATED RINGERS IV SOLN
INTRAVENOUS | Status: DC | PRN
  Administered 2012-04-05 (×2): via INTRAVENOUS

## 2012-04-05 MED ORDER — PROMETHAZINE HCL 25 MG PO TABS: 25.0000 mg | ORAL_TABLET | Freq: Three times a day (TID) | ORAL | Status: DC | PRN

## 2012-04-05 MED ORDER — THROMBIN 20000 UNITS EX SOLR
CUTANEOUS | Status: AC
  Filled 2012-04-05: qty 20000

## 2012-04-05 MED ORDER — GLYCOPYRROLATE 0.2 MG/ML IJ SOLN
INTRAMUSCULAR | Status: DC | PRN
  Administered 2012-04-05: .8 mg via INTRAVENOUS

## 2012-04-05 MED ORDER — ONDANSETRON HCL 4 MG/2ML IJ SOLN: 4.0000 mg | INTRAMUSCULAR | Status: DC | PRN

## 2012-04-05 MED ORDER — DEXTROSE 5 % IV SOLN
1000.0000 mg | INTRAVENOUS | Status: DC | PRN
  Administered 2012-04-05: 1000 mg via INTRAVENOUS

## 2012-04-05 MED ORDER — SODIUM CHLORIDE 0.9 % IJ SOLN: 3.0000 mL | INTRAMUSCULAR | Status: DC | PRN

## 2012-04-05 MED ORDER — SODIUM CHLORIDE 0.9 % IR SOLN
Status: DC | PRN
  Administered 2012-04-05: 09:00:00

## 2012-04-05 MED ORDER — KCL IN DEXTROSE-NACL 20-5-0.45 MEQ/L-%-% IV SOLN
INTRAVENOUS | Status: AC
  Filled 2012-04-05: qty 1000

## 2012-04-05 MED ORDER — FENTANYL CITRATE 0.05 MG/ML IJ SOLN
INTRAMUSCULAR | Status: DC | PRN
  Administered 2012-04-05 (×8): 50 ug via INTRAVENOUS

## 2012-04-05 MED ORDER — ACETAMINOPHEN 650 MG RE SUPP: 650.0000 mg | RECTAL | Status: DC | PRN

## 2012-04-05 MED ORDER — DOCUSATE SODIUM 100 MG PO CAPS
100.0000 mg | ORAL_CAPSULE | Freq: Two times a day (BID) | ORAL | Status: DC
  Administered 2012-04-05 – 2012-04-09 (×9): 100 mg via ORAL
  Filled 2012-04-05 (×10): qty 1

## 2012-04-05 MED ORDER — DEXTROSE 5 % IV SOLN
500.0000 mg | Freq: Four times a day (QID) | INTRAVENOUS | Status: DC | PRN
  Administered 2012-04-05: 500 mg via INTRAVENOUS
  Filled 2012-04-05 (×2): qty 5

## 2012-04-05 MED ORDER — ZOLPIDEM TARTRATE 5 MG PO TABS: 5.0000 mg | ORAL_TABLET | Freq: Every evening | ORAL | Status: DC | PRN

## 2012-04-05 MED ORDER — BUPIVACAINE HCL (PF) 0.25 % IJ SOLN
INTRAMUSCULAR | Status: AC
  Filled 2012-04-05: qty 30

## 2012-04-05 MED ORDER — MENTHOL 3 MG MT LOZG: 1.0000 | LOZENGE | OROMUCOSAL | Status: DC | PRN

## 2012-04-05 MED ORDER — KCL IN DEXTROSE-NACL 20-5-0.45 MEQ/L-%-% IV SOLN
INTRAVENOUS | Status: DC
  Administered 2012-04-05 – 2012-04-07 (×4): via INTRAVENOUS
  Filled 2012-04-05 (×11): qty 1000

## 2012-04-05 MED ORDER — BISACODYL 10 MG RE SUPP: 10.0000 mg | Freq: Every day | RECTAL | Status: DC | PRN

## 2012-04-05 MED ORDER — MIDAZOLAM HCL 5 MG/5ML IJ SOLN
INTRAMUSCULAR | Status: DC | PRN
  Administered 2012-04-05: 2 mg via INTRAVENOUS

## 2012-04-05 MED ORDER — LIDOCAINE HCL (CARDIAC) 20 MG/ML IV SOLN
INTRAVENOUS | Status: DC | PRN
  Administered 2012-04-05: 50 mg via INTRAVENOUS
  Administered 2012-04-05: 100 mg via INTRAVENOUS

## 2012-04-05 MED ORDER — SODIUM CHLORIDE 0.9 % IJ SOLN
INTRAMUSCULAR | Status: DC | PRN
  Administered 2012-04-05: 10 mL

## 2012-04-05 MED ORDER — PANTOPRAZOLE SODIUM 40 MG IV SOLR
40.0000 mg | Freq: Every day | INTRAVENOUS | Status: DC
  Administered 2012-04-05: 40 mg via INTRAVENOUS
  Filled 2012-04-05 (×2): qty 40

## 2012-04-05 SURGICAL SUPPLY — 80 items
6.0x40 screw ×8 IMPLANT
ALIF 13mm Large ×2 IMPLANT
APPLIER CLIP 11 MED OPEN (CLIP) ×2
BENZOIN TINCTURE PRP APPL 2/3 (GAUZE/BANDAGES/DRESSINGS) ×2 IMPLANT
CANISTER SUCTION 2500CC (MISCELLANEOUS) ×2 IMPLANT
CLIP APPLIE 11 MED OPEN (CLIP) ×1 IMPLANT
CLOTH BEACON ORANGE TIMEOUT ST (SAFETY) ×2 IMPLANT
CONT SPEC STER OR (MISCELLANEOUS) ×2 IMPLANT
CORDS BIPOLAR (ELECTRODE) ×2 IMPLANT
COVER SURGICAL LIGHT HANDLE (MISCELLANEOUS) ×4 IMPLANT
DERMABOND ADVANCED (GAUZE/BANDAGES/DRESSINGS) ×1
DERMABOND ADVANCED .7 DNX12 (GAUZE/BANDAGES/DRESSINGS) ×1 IMPLANT
DRAIN TLS ROUND 10FR (DRAIN) IMPLANT
DRAPE C-ARM 42X72 X-RAY (DRAPES) ×4 IMPLANT
DRAPE PROXIMA HALF (DRAPES) ×6 IMPLANT
DRAPE SURG 17X23 STRL (DRAPES) ×8 IMPLANT
DRAPE TABLE COVER HEAVY DUTY (DRAPES) ×2 IMPLANT
DRILL 3.5X20MM ×2 IMPLANT
DRILL 3.5X65MM ×2 IMPLANT
DRSG MEPILEX BORDER 4X12 (GAUZE/BANDAGES/DRESSINGS) ×2 IMPLANT
DRSG MEPILEX BORDER 4X8 (GAUZE/BANDAGES/DRESSINGS) IMPLANT
DRSG OPSITE 6X11 MED (GAUZE/BANDAGES/DRESSINGS) ×4 IMPLANT
ELECT BLADE 4.0 EZ CLEAN MEGAD (MISCELLANEOUS) ×2
ELECT CAUTERY BLADE 6.4 (BLADE) ×2 IMPLANT
ELECT REM PT RETURN 9FT ADLT (ELECTROSURGICAL) ×2
ELECTRODE BLDE 4.0 EZ CLN MEGD (MISCELLANEOUS) ×1 IMPLANT
ELECTRODE REM PT RTRN 9FT ADLT (ELECTROSURGICAL) ×1 IMPLANT
EVACUATOR 1/8 PVC DRAIN (DRAIN) IMPLANT
GAUZE SPONGE 4X4 16PLY XRAY LF (GAUZE/BANDAGES/DRESSINGS) ×2 IMPLANT
GLOVE BIO SURGEON STRL SZ7.5 (GLOVE) ×4 IMPLANT
GLOVE BIOGEL PI IND STRL 7.5 (GLOVE) IMPLANT
GLOVE BIOGEL PI IND STRL 9 (GLOVE) ×1 IMPLANT
GLOVE BIOGEL PI INDICATOR 7.5 (GLOVE)
GLOVE BIOGEL PI INDICATOR 9 (GLOVE) ×1
GLOVE BIOGEL PI ORTHO PRO SZ7 (GLOVE) ×2
GLOVE ECLIPSE 7.5 STRL STRAW (GLOVE) ×2 IMPLANT
GLOVE PI ORTHO PRO STRL SZ7 (GLOVE) ×2 IMPLANT
GLOVE SS BIOGEL STRL SZ 7 (GLOVE) IMPLANT
GLOVE SS BIOGEL STRL SZ 8.5 (GLOVE) ×1 IMPLANT
GLOVE SUPERSENSE BIOGEL SZ 7 (GLOVE)
GLOVE SUPERSENSE BIOGEL SZ 8.5 (GLOVE) ×1
GOWN PREVENTION PLUS XLARGE (GOWN DISPOSABLE) ×2 IMPLANT
GOWN STRL NON-REIN LRG LVL3 (GOWN DISPOSABLE) ×4 IMPLANT
KIT BASIN OR (CUSTOM PROCEDURE TRAY) ×2 IMPLANT
KIT ROOM TURNOVER OR (KITS) ×2 IMPLANT
NEEDLE BONE MARROW 8GAX6 (NEEDLE) IMPLANT
NEEDLE SPNL 18GX3.5 QUINCKE PK (NEEDLE) ×2 IMPLANT
NEEDLE SPNL 22GX3.5 QUINCKE BK (NEEDLE) ×2 IMPLANT
NS IRRIG 1000ML POUR BTL (IV SOLUTION) ×2 IMPLANT
PACK LAMINECTOMY ORTHO (CUSTOM PROCEDURE TRAY) ×2 IMPLANT
PAD ARMBOARD 7.5X6 YLW CONV (MISCELLANEOUS) ×4 IMPLANT
PUTTY OP1 (Orthopedic Implant) ×2 IMPLANT
SPONGE GAUZE 4X4 12PLY (GAUZE/BANDAGES/DRESSINGS) ×2 IMPLANT
SPONGE INTESTINAL PEANUT (DISPOSABLE) ×6 IMPLANT
SPONGE SURGIFOAM ABS GEL 100 (HEMOSTASIS) IMPLANT
STRIP CLOSURE SKIN 1/2X4 (GAUZE/BANDAGES/DRESSINGS) ×2 IMPLANT
SURGIFLO TRUKIT (HEMOSTASIS) IMPLANT
SUT ETHILON 2 0 FS 18 (SUTURE) ×2 IMPLANT
SUT PROLENE 4 0 RB 1 (SUTURE)
SUT PROLENE 4-0 RB1 .5 CRCL 36 (SUTURE) IMPLANT
SUT PROLENE 5 0 CC1 (SUTURE) ×2 IMPLANT
SUT PROLENE 6 0 C 1 30 (SUTURE) IMPLANT
SUT PROLENE 6 0 CC (SUTURE) IMPLANT
SUT SILK 2 0 TIES 10X30 (SUTURE) ×2 IMPLANT
SUT SILK 3 0 TIES 10X30 (SUTURE) ×2 IMPLANT
SUT VIC AB 1 CT1 27 (SUTURE) ×2
SUT VIC AB 1 CT1 27XBRD ANBCTR (SUTURE) ×2 IMPLANT
SUT VIC AB 2-0 CT1 27 (SUTURE) ×2
SUT VIC AB 2-0 CT1 TAPERPNT 27 (SUTURE) ×2 IMPLANT
SUT VIC AB 3-0 X1 27 (SUTURE) ×4 IMPLANT
SYR 20CC LL (SYRINGE) IMPLANT
SYR BULB IRRIGATION 50ML (SYRINGE) ×2 IMPLANT
SYR CONTROL 10ML LL (SYRINGE) ×2 IMPLANT
SYSTEM CHEST DRAIN TLS 7FR (DRAIN) IMPLANT
TOWEL OR 17X24 6PK STRL BLUE (TOWEL DISPOSABLE) ×2 IMPLANT
TOWEL OR 17X26 10 PK STRL BLUE (TOWEL DISPOSABLE) ×2 IMPLANT
TRAY FOLEY CATH 14FRSI W/METER (CATHETERS) ×2 IMPLANT
TUBE SUCT ARGYLE STRL (TUBING) ×2 IMPLANT
WATER STERILE IRR 1000ML POUR (IV SOLUTION) ×2 IMPLANT
lateral cayman plate 46mm ×2 IMPLANT

## 2012-04-05 NOTE — Anesthesia Preprocedure Evaluation (Addendum)
Anesthesia Evaluation  Patient identified by MRN, date of birth, ID band Patient awake    Reviewed: Allergy & Precautions, H&P , NPO status , Patient's Chart, lab work & pertinent test results  Airway Mallampati: I      Dental  (+) Teeth Intact   Pulmonary neg pulmonary ROS,  breath sounds clear to auscultation        Cardiovascular hypertension, Pt. on medications Rhythm:Regular Rate:Normal     Neuro/Psych  Neuromuscular disease negative psych ROS   GI/Hepatic negative GI ROS, Neg liver ROS,   Endo/Other  Well Controlled, Type 2  Renal/GU negative Renal ROS     Musculoskeletal   Abdominal   Peds  Hematology negative hematology ROS (+)   Anesthesia Other Findings   Reproductive/Obstetrics                        Anesthesia Physical Anesthesia Plan  ASA: III  Anesthesia Plan: General   Post-op Pain Management:    Induction: Intravenous  Airway Management Planned: Oral ETT  Additional Equipment:   Intra-op Plan:   Post-operative Plan: Extubation in OR  Informed Consent:   Dental advisory given  Plan Discussed with: CRNA, Anesthesiologist and Surgeon  Anesthesia Plan Comments:        Anesthesia Quick Evaluation

## 2012-04-05 NOTE — Interval H&P Note (Signed)
Evaluated in the preop room this morning, no change since evaluation performed last week, 31 July, 2013

## 2012-04-05 NOTE — Preoperative (Signed)
Beta Blockers   Reason not to administer Beta Blockers:Not Applicable 

## 2012-04-05 NOTE — Transfer of Care (Signed)
Immediate Anesthesia Transfer of Care Note  Patient: Christopher [name]  Procedure(s) Performed: Procedure(s) (LRB): ANTERIOR LUMBAR FUSION 1 LEVEL (N/A)  Patient Location: PACU  Anesthesia Type: General  Level of Consciousness: awake, alert  and oriented  Airway & Oxygen Therapy: Patient Spontanous Breathing and Patient connected to nasal cannula oxygen  Post-op Assessment: Report given to PACU RN, Post -op Vital signs reviewed and stable and Patient moving all extremities X 4  Post vital signs: Reviewed and stable  Complications: No apparent anesthesia complications

## 2012-04-05 NOTE — Anesthesia Postprocedure Evaluation (Signed)
  Anesthesia Post-op Note  Patient: Christopher [name]  Procedure(s) Performed: Procedure(s) (LRB): ANTERIOR LUMBAR FUSION 1 LEVEL (N/A)  Patient Location: PACU  Anesthesia Type: General  Level of Consciousness: awake, alert , oriented and patient cooperative  Airway and Oxygen Therapy: Patient Spontanous Breathing and Patient connected to nasal cannula oxygen  Post-op Pain: mild  Post-op Assessment: Post-op Vital signs reviewed, Patient's Cardiovascular Status Stable, Respiratory Function Stable, Patent Airway, No signs of Nausea or vomiting and Pain level controlled  Post-op Vital Signs: stable  Complications: No apparent anesthesia complications

## 2012-04-05 NOTE — Op Note (Signed)
Preoperative diagnosis: Pseudoarthrosis L3-4 status post posterior thoracolumbar fusion, status post posterior repair of L342 arthrosis  Postoperative diagnosis: Same  Operative procedure: Anterior interbody L3-4 fusion, utilizing Titan cage, anter titanium plate, local bone graft, OP-1, via retroperitoneal exposure  Operative note: The patient was placed under general endotracheal anesthesia. Vancomycin was infused intravenously. Sequential compression devices were placed on both lower extremities. A Foley catheter was placed in the bladder. The position patient was positioned in the right lateral decubitus position, left side up. He was secured using broad adhesive tape, using grounding pads to protect the skin. The left flank, lower thorax and upper gluteal area were prepped with DuraPrep and draped in square fashion. A fluoroscopy unit was moved in and a lateral view taken and the position of the L3-4 disc marked on the skin.  A timeout was held during which the usual information was confirmed/discussed.  Incision was made beginning proximally over the distal half the 12th rib and carrying incision distally and medially to a point distal to the L3-4 disc space as identified on fluoroscopy. Incision was carried a.m. to the 12th rib. The distal one half the 12th rib was resected subperiosteally. The retroperitoneal space was identified deep to the costal cartilage of the rib. The external oblique, internal oblique and transversus abdominis were incised with cutting current in line with the incision. The retroperitoneum was bluntly dissected to reveal the psoas muscle. Psoas was mobilized from anterior posterior and what was evidently the L34 L4-5 disc was identified and a spinal needle placed. Fluoroscopy view was taken laptop on the lateral plane and it was the L3-4 disc.  Psoas and mobilized above and below the L3-4 disc. Segmental vessels at L3 and L4 controlled using bipolar coagulation. We then  placed Thompson retractors to keep the psoas retracted and the retroperitoneal contents retracted anteriorly. Prior placed in the retractors, we had identified the ureter and placed a sponge in front of the vertebral body and behind the ureter. This was the left ureter.  We then prepared the disc space using a combination of curettes, rongeurs, osteotomes. When the disc space had been satisfactorily prepared, that is removal of most of the nucleus, a great deal of circumferential  annulus and the endplates, we trialed a Titan implant and chose a 13 mm implant. This was then packed with a mixture of OP-1 with locally harvested graft including morselized rib. It was impacted into place and positioned judged to be satisfactory on AP and lateral for fluoroscopic views.  Next we applied a K2 M. anterior lumbar plate of appropriate length, 2 screws above and below. Once the screws were seated AP and lateral fluoroscopic views showed satisfactory position of the plate and screws and each screw was torqued.  The wound was then closed in layers using continuous #1 Vicryl suture in the internal oblique and transversus abdominis layer and similarly a continuous #1 Vicryl suture in the external oblique layer. At this point a crosstable AP of the abdomen was taken to rule out the presence of any retained sponges or instruments, as per protocol for anterior lumbar fusion cages.  The simultaneous layer was closed using interrupted 0 Vicryl suture in continuous 2-0 Vicryl suture. The skin was closed using staples. MedPlex dressing was applied.  The patient was then transferred to his bed and awakened. In the recovery room he is alert and can move both lower extremities. Prior to closing the wound we did inject the external oblique fascia with X. per hour,  20 cc diluted to a 30 cc volume.  Sponge and needle counts were correct. There were no intraoperative complications. The estimated blood loss was 250 cc.  Subsequent  to evaluating the recovery room, I did speak with his wife.

## 2012-04-05 NOTE — Progress Notes (Signed)
UR COMPLETED  

## 2012-04-05 NOTE — Anesthesia Procedure Notes (Addendum)
Procedure Name: Intubation Date/Time: 04/05/2012 7:51 AM Performed by: Quentin Ore Pre-anesthesia Checklist: Patient identified, Emergency Drugs available, Suction available, Patient being monitored and Timeout performed Patient Re-evaluated:Patient Re-evaluated prior to inductionOxygen Delivery Method: Circle system utilized Preoxygenation: Pre-oxygenation with 100% oxygen Intubation Type: IV induction Ventilation: Mask ventilation without difficulty and Oral airway inserted - appropriate to patient size Laryngoscope Size: Mac and 4 Grade View: Grade II Tube size: 8.0 mm Number of attempts: 1 Airway Equipment and Method: Stylet Placement Confirmation: ETT inserted through vocal cords under direct vision,  positive ETCO2 and breath sounds checked- equal and bilateral Secured at: 24 cm Tube secured with: Tape Dental Injury: Teeth and Oropharynx as per pre-operative assessment  Comments: Intubation per SRNA JPS. Head and neck maintained in neutral throughout, supported on foam pillow.

## 2012-04-05 NOTE — Brief Op Note (Signed)
Preoperative diagnosis: L3-4 pseudoarthrosis status post posterior fusion and posterior repair of pseudoarthrosis  Postoperative diagnosis: The same  Operative procedure: Anterior L3-4 interbody fusion, utilizing titanium cage, anterior titanium plate (K2 M.), OP-1, local bone graft, via retroperitoneal approach  Complications: Nil  Estimated blood loss: 250 cc

## 2012-04-06 ENCOUNTER — Encounter (HOSPITAL_COMMUNITY): Payer: Self-pay | Admitting: General Practice

## 2012-04-06 LAB — BASIC METABOLIC PANEL
CO2: 26 mEq/L (ref 19–32)
Calcium: 8.1 mg/dL — ABNORMAL LOW (ref 8.4–10.5)
Creatinine, Ser: 2.45 mg/dL — ABNORMAL HIGH (ref 0.50–1.35)
Glucose, Bld: 103 mg/dL — ABNORMAL HIGH (ref 70–99)

## 2012-04-06 LAB — CBC WITH DIFFERENTIAL/PLATELET
Eosinophils Relative: 1 % (ref 0–5)
HCT: 33.4 % — ABNORMAL LOW (ref 39.0–52.0)
Hemoglobin: 10.9 g/dL — ABNORMAL LOW (ref 13.0–17.0)
Lymphocytes Relative: 22 % (ref 12–46)
Lymphs Abs: 2.2 10*3/uL (ref 0.7–4.0)
MCV: 90.5 fL (ref 78.0–100.0)
Monocytes Absolute: 0.9 10*3/uL (ref 0.1–1.0)
Monocytes Relative: 9 % (ref 3–12)
RBC: 3.69 MIL/uL — ABNORMAL LOW (ref 4.22–5.81)
WBC: 10.2 10*3/uL (ref 4.0–10.5)

## 2012-04-06 LAB — GLUCOSE, CAPILLARY
Glucose-Capillary: 84 mg/dL (ref 70–99)
Glucose-Capillary: 104 mg/dL — ABNORMAL HIGH (ref 70–99)

## 2012-04-06 MED ORDER — DIPHENHYDRAMINE HCL 50 MG/ML IJ SOLN: 12.5000 mg | Freq: Four times a day (QID) | INTRAMUSCULAR | Status: DC | PRN

## 2012-04-06 MED ORDER — SODIUM CHLORIDE 0.9 % IJ SOLN: 9.0000 mL | INTRAMUSCULAR | Status: DC | PRN

## 2012-04-06 MED ORDER — ONDANSETRON HCL 4 MG/2ML IJ SOLN: 4.0000 mg | Freq: Four times a day (QID) | INTRAMUSCULAR | Status: DC | PRN

## 2012-04-06 MED ORDER — PANTOPRAZOLE SODIUM 40 MG PO TBEC
40.0000 mg | DELAYED_RELEASE_TABLET | Freq: Every day | ORAL | Status: DC
  Administered 2012-04-07 – 2012-04-09 (×3): 40 mg via ORAL
  Filled 2012-04-06 (×3): qty 1

## 2012-04-06 MED ORDER — TAPENTADOL HCL 50 MG PO TABS: 50.0000 mg | ORAL_TABLET | Freq: Four times a day (QID) | ORAL | Status: DC | PRN

## 2012-04-06 MED ORDER — TAPENTADOL HCL 50 MG PO TABS
75.0000 mg | ORAL_TABLET | Freq: Four times a day (QID) | ORAL | Status: DC | PRN
  Administered 2012-04-06 – 2012-04-09 (×11): 75 mg via ORAL
  Filled 2012-04-06: qty 1
  Filled 2012-04-06 (×2): qty 2
  Filled 2012-04-06: qty 4
  Filled 2012-04-06 (×8): qty 2

## 2012-04-06 MED ORDER — NALOXONE HCL 0.4 MG/ML IJ SOLN: 0.4000 mg | INTRAMUSCULAR | Status: DC | PRN

## 2012-04-06 MED ORDER — HYDROMORPHONE HCL PF 1 MG/ML IJ SOLN
0.5000 mg | INTRAMUSCULAR | Status: DC | PRN
  Administered 2012-04-06 – 2012-04-08 (×6): 0.5 mg via INTRAVENOUS
  Filled 2012-04-06 (×5): qty 1

## 2012-04-06 MED ORDER — HYDROMORPHONE 0.3 MG/ML IV SOLN
INTRAVENOUS | Status: DC
  Administered 2012-04-06: 2.73 mg via INTRAVENOUS

## 2012-04-06 MED ORDER — DIPHENHYDRAMINE HCL 12.5 MG/5ML PO ELIX: 12.5000 mg | ORAL_SOLUTION | Freq: Four times a day (QID) | ORAL | Status: DC | PRN

## 2012-04-06 MED ORDER — METFORMIN HCL 500 MG PO TABS
500.0000 mg | ORAL_TABLET | Freq: Every day | ORAL | Status: DC
  Administered 2012-04-06 – 2012-04-08 (×3): 500 mg via ORAL
  Filled 2012-04-06 (×4): qty 1

## 2012-04-06 MED ORDER — SODIUM CHLORIDE 0.9 % IV BOLUS (SEPSIS)
500.0000 mL | Freq: Once | INTRAVENOUS | Status: AC
  Administered 2012-04-06: 500 mL via INTRAVENOUS

## 2012-04-06 MED FILL — Sodium Chloride IV Soln 0.9%: INTRAVENOUS | Qty: 1000 | Status: AC

## 2012-04-06 MED FILL — Sodium Chloride Irrigation Soln 0.9%: Qty: 3000 | Status: AC

## 2012-04-06 MED FILL — Heparin Sodium (Porcine) Inj 1000 Unit/ML: INTRAMUSCULAR | Qty: 30 | Status: AC

## 2012-04-06 NOTE — Progress Notes (Signed)
Occupational Therapy Evaluation Patient Details Name: Christopher [name] MRN: [id number] DOB: 1961/01/05 Today's Date: 04/06/2012 Time: 0272-5366 OT Time Calculation (min): 17 min  OT Assessment / Plan / Recommendation Clinical Impression  51 yo s/p L3 - 4 lumbar fusion. Pt will benefit from skilled Ot services due to deficits below to max independence with ADl and functional mobility for ADL with nec AE to facilitate d/C home with family.    OT Assessment  Patient needs continued OT Services    Follow Up Recommendations  No OT follow up    Barriers to Discharge None    Equipment Recommendations  None recommended by OT    Recommendations for Other Services    Frequency  Min 2X/week    Precautions / Restrictions Precautions Precautions: Back Precaution Booklet Issued: Yes (comment)   Pertinent Vitals/Pain 4    ADL  Grooming: Minimal assistance Where Assessed - Grooming: Unsupported sitting Upper Body Bathing: Simulated;Set up Where Assessed - Upper Body Bathing: Unsupported sitting Lower Body Bathing: Simulated;Moderate assistance Where Assessed - Lower Body Bathing: Supported sit to stand Upper Body Dressing: Simulated;Set up Where Assessed - Upper Body Dressing: Unsupported sitting Lower Body Dressing: Simulated;Moderate assistance Where Assessed - Lower Body Dressing: Supported sit to stand Toilet Transfer: Counsellor Method: Sit to stand;Stand pivot Acupuncturist: Comfort height toilet Toileting - Clothing Manipulation and Hygiene: Simulated;Maximal assistance;Other (comment) (A with hygiene) Where Assessed - Toileting Clothing Manipulation and Hygiene: Standing Transfers/Ambulation Related to ADLs: S. ambulated to chair without RW ADL Comments: will benefit from AE for LB ADl and hygiene after toileting.    OT Diagnosis: Generalized weakness;Acute pain  OT Problem List: Decreased strength;Decreased range of motion;Decreased  activity tolerance;Decreased knowledge of use of DME or AE;Decreased knowledge of precautions;Obesity;Pain OT Treatment Interventions: Self-care/ADL training;DME and/or AE instruction;Therapeutic activities;Patient/family education   OT Goals Acute Rehab OT Goals OT Goal Formulation: With patient Time For Goal Achievement: 04/13/12 Potential to Achieve Goals: Good ADL Goals Pt Will Perform Lower Body Bathing: with supervision;with caregiver independent in assisting;Sit to stand from chair;Unsupported;with adaptive equipment ADL Goal: Lower Body Bathing - Progress: Goal set today Pt Will Perform Lower Body Dressing: with supervision;with caregiver independent in assisting;Sit to stand from chair;Unsupported;with adaptive equipment;with cueing (comment type and amount) ADL Goal: Lower Body Dressing - Progress: Goal set today Pt Will Transfer to Toilet: with modified independence;with DME;Ambulation ADL Goal: Toilet Transfer - Progress: Goal set today Pt Will Perform Toileting - Hygiene: with supervision;with caregiver independent in assisting;with adaptive equipment;Sit to stand from 3-in-1/toilet;Standing at 3-in-1/toilet ADL Goal: Toileting - Hygiene - Progress: Goal set today Additional ADL Goal #1: Pt will state 3.3 back precautions. ADL Goal: Additional Goal #1 - Progress: Goal set today  Visit Information  Last OT Received On: 04/06/12 Assistance Needed: +1    Subjective Data      Prior Functioning  Vision/Perception  Home Living Lives With: Family Available Help at Discharge: Family;Available 24 hours/day Type of Home: House Home Access: Ramped entrance Home Layout: One level Bathroom Shower/Tub: Health visitor: Handicapped height Bathroom Accessibility: Yes Home Adaptive Equipment: Bedside commode/3-in-1;Grab bars in shower;Walker - rolling;Wheelchair - manual;Tub transfer bench Prior Function Level of Independence: Needs assistance Driving:  Yes 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 difficulties Dominant Hand: Right      Cognition  Overall Cognitive Status: Appears within functional limits for tasks assessed/performed Arousal/Alertness: Awake/alert Orientation Level: Appears intact for tasks assessed Behavior During Session: [facility] for tasks  performed    Extremity/Trunk Assessment Right Upper Extremity Assessment RUE ROM/Strength/Tone: Within functional levels Left Upper Extremity Assessment LUE ROM/Strength/Tone: Within functional levels   Mobility Bed Mobility Bed Mobility: Rolling Left;Left Sidelying to Sit;Sitting - Scoot to Edge of Bed Rolling Right: 5: Supervision Rolling Left: 5: Supervision Left Sidelying to Sit: 5: Supervision Supine to Sit: 5: Supervision Sitting - Scoot to Edge of Bed: 6: Modified independent (Device/Increase time) Details for Bed Mobility Assistance: log roll technique Transfers Transfers: Sit to Stand;Stand to Sit Sit to Stand: 5: Supervision;With upper extremity assist;From bed Stand to Sit: 5: Supervision;To chair/3-in-1;With upper extremity assist Details for Transfer Assistance: good technique   Exercise    Balance  WFl  End of Session OT - End of Session Activity Tolerance: Patient tolerated treatment well Patient left: in chair;with call bell/phone within reach;with family/visitor present Nurse Communication: Mobility status  GO     Petar Mucci,HILLARY 04/06/2012, 5:05 PM [facility], OTR/L  [phone] 04/06/2012

## 2012-04-06 NOTE — Progress Notes (Signed)
OT Cancellation Note  Treatment cancelled today due to pt just received IV pain meds and was not able to participate with OT. Will see in am..  [facility] Paraskevi Funez, OTR/L  [phone] 04/06/2012 04/06/2012, 4:24 PM

## 2012-04-06 NOTE — Evaluation (Signed)
Physical Therapy Evaluation Patient Details Name: Christopher [name] MRN: [id number] DOB: [birth year] Today's Date: 04/06/2012 Time: 1141-1200 PT Time Calculation (min): 19 min  PT Assessment / Plan / Recommendation Clinical Impression  Pt is a 51 yo male s/p lumbar fusion.  Pt demonstrates deficits in activity tolerance secondary to pain.  Pt has good family support and assistance.  Pt may benefit from continued skilled physical therapy to improve activity tolerance and overall functional mobility.    PT Assessment  Patient needs continued PT services    Follow Up Recommendations  Home health PT    Barriers to Discharge None            Frequency Min 5X/week    Precautions / Restrictions Restrictions Weight Bearing Restrictions: No   Pertinent Vitals/Pain 8/10 pre activity; 7/10 post ambulation      Mobility  Bed Mobility Bed Mobility: Rolling Right;Supine to Sit Rolling Right: 4: Min guard Supine to Sit: 4: Min guard Details for Bed Mobility Assistance: log roll technique Transfers Transfers: Sit to Stand;Stand to Sit Sit to Stand: 4: Min guard;From bed Stand to Sit: 4: Min guard;To chair/3-in-1;With armrests Details for Transfer Assistance: VCs for hand placement Ambulation/Gait Ambulation/Gait Assistance: 4: Min guard Ambulation Distance (Feet): 160 Feet Assistive device: Rolling walker Ambulation/Gait Assistance Details: VCs for upright posture Gait Pattern: Step-through pattern;Decreased stride length Gait velocity: decreased    Exercises     PT Diagnosis: Difficulty walking  PT Problem List: Decreased strength;Decreased activity tolerance;Decreased mobility PT Treatment Interventions: Gait training;Therapeutic activities;Patient/family education   PT Goals Acute Rehab PT Goals PT Goal Formulation: With patient Time For Goal Achievement: 04/09/12 Potential to Achieve Goals: Good Pt will go Supine/Side to Sit: with modified independence PT Goal: Supine/Side  to Sit - Progress: Goal set today Pt will Stand: with modified independence PT Goal: Stand - Progress: Goal set today Pt will Ambulate: >150 feet;with modified independence;with least restrictive assistive device PT Goal: Ambulate - Progress: Goal set today  Visit Information  Last PT Received On: 04/06/12 Assistance Needed: +1    Subjective Data  Subjective: Would love to get up to eat Patient Stated Goal: to go home   Prior Functioning  Home Living Lives With: Family Available Help at Discharge: Family;Available 24 hours/day Type of Home: House Home Access: Ramped entrance Home Layout: One level Bathroom Shower/Tub: Health visitor: Handicapped height Bathroom Accessibility: Yes Home Adaptive Equipment: Bedside commode/3-in-1;Grab bars in shower;Walker - rolling;Wheelchair - manual;Tub transfer bench Prior Function Level of Independence: Needs assistance Driving: Yes Vocation: Retired    IT consultant  Overall Cognitive Status: Appears within functional limits for tasks assessed/performed Arousal/Alertness: Awake/alert Orientation Level: Appears intact for tasks assessed Behavior During Session: [facility] for tasks performed    Extremity/Trunk Assessment Right Lower Extremity Assessment RLE ROM/Strength/Tone: WFL for tasks assessed RLE Sensation: WFL - Light Touch;WFL - Proprioception RLE Coordination: WFL - gross motor Left Lower Extremity Assessment LLE ROM/Strength/Tone: WFL for tasks assessed LLE Sensation: WFL - Light Touch;WFL - Proprioception LLE Coordination: WFL - gross motor   Balance    End of Session PT - End of Session Equipment Utilized During Treatment: Gait belt Activity Tolerance: Patient tolerated treatment well Patient left: in chair;with call bell/phone within reach;with family/visitor present Nurse Communication: Mobility status  GP     Fabio Asa 04/06/2012, 12:37 PM Charlotte Crumb, PT DPT  [phone]

## 2012-04-06 NOTE — Progress Notes (Signed)
CARE MANAGEMENT NOTE 04/06/2012  Patient:  Christopher [name], Christopher [name]   Account Number:  [id number]  Date Initiated:  04/06/2012  Documentation initiated by:  Vance Peper  Subjective/Objective Assessment:   51 yr old male s/p L3-4 fusion     Action/Plan:   CM spoke with patient. States he is under workers comp. Has rolling walker, and wife to assist at discharge.   Anticipated DC Date:  04/07/2012   Anticipated DC Plan:  HOME/SELF CARE      DC Planning Services  CM consult      Choice offered to / List presented to:             Status of service:  In process, will continue to follow Medicare Important Message given?   (If response is "NO", the following Medicare IM given date fields will be blank) Date Medicare IM given:   Date Additional Medicare IM given:    Discharge Disposition:    Per UR Regulation:    If discussed at Long Length of Stay Meetings, dates discussed:    Comments:  04/06/12  10:13 Anette Guarneri RN/CM Workers Comp. [place]/ph# [phone] WC [id number] ID# [id number] DOI 06/01/2004

## 2012-04-06 NOTE — Progress Notes (Signed)
Referral received for SNF. Chart reviewed and CSW has spoken with [facility] who indicates that patient is for DC to home at d/c.  He is not for SNF placement. RNCM will arrange home care needs as indicated. Patient has not yet been evaluated by PT.  Please re-consult if CSW needs arise.  Lorri Frederick. [place]  [phone]

## 2012-04-06 NOTE — Progress Notes (Signed)
Patient stating increased pain last night even with Full Dose Dilaudid PCA pump, spoke with Dr. Alveda Reasons at 2045, new orders to put patient on a basal rate of 0.5mg  Dilaudid per hour as well as to add Phenergan 25mg  IV q6h x48 hours.  Patient with noted pain relief thereafter, and refused 2200 Nucynta dose.  Patient resting throughout night without distress.  Respirations and pulse ox WDL throughout night.  Vitals checked at 0345, temp 101.2, BP 70/37, HR 100, RR 18, pulse ox at 100% on 2L [place], patient showing no signs of hypotension.  Patient denies feeling dizzy or nauseated.  Two Tylenol given for temp.  Spoke with Dr. Alveda Reasons, bolus of normal saline ordered, and instructed to not give 0400 dose of Phenergan.  BP post bolus 76/48 at 0528, and then 86/56 at 0550.  Spoke with Dr. Alveda Reasons again, whom ordered a CBC and BMP, and to d/c basal rate of Dilaudid and reduce Full Dose Dilaudid PCA to Reduced Dose.  Vital signs at 0625: temp 97.5, BP 87/40, HR 60, RR 18 pulse ox at 97% on 2L .  Reduced Dose Dilaudid restarted, 0600 dose of Nucynta given per instructions of Dr. Alveda Reasons.  Patient's BP at 0728 91/61, no s/s of distress.  Nursing to continue to monitor.

## 2012-04-06 NOTE — Progress Notes (Signed)
Seen and evaluated in room 5 and 0 for this morning  Aside I was notified of oh 3 AM that he was running a low systolic blood pressure. This was in the range of 80. His urine output was satisfactory. He is not clearly tachycardic, heart rate 100. He was lucid and conscious. His oxygen saturation was in the normal range. There was concern about giving him more narcotics. We did order 100 cc bolus of normal saline. This did not have much effect. I discussed the situation with the nurse began at about 6 AM. At that point she had turned off his PCA, which I think is a good judgment. He was comfortable.  This morning he is comfortable. He complains of some pain in the region of the incision but his back does not hurt. His hemoglobin is above 10. Electrolytes are normal, but his creatinine is elevated and the calculated GFR is abnormal.  On physical examination he is oriented and lucid. He moves both lower extremities well.  Assessment: Hypotension probably secondary to narcotics, without any evidence of perfusion deficit or hypovolemia.  Plan: We will discontinue his PCA Dilaudid-HP. He will continue on Nucynta ER 50 mg, which actually is his own medication which has been loaded into the [facility]. He will resume his Nucynta IR 50 mg, which he was taking for breakthrough pain at home. He will continue to take for breakthrough pain here. If the Nucynta ER and Nucynta IR do not hold him, he may have Dilaudid-HP 0.5 mg IV, which I have ordered.  Additionally, concerned about all the medications listed as allergies. These are clearly not allergies and he has been given most of the medications on numerous times. The reason they get listed as allergies is because he reports that he experienced shortness of breath after some medication, nausea after some etc. etc. The and 3 at listing of allergies could really lead to a very detrimental situation for him, if he encounters a provider who does not [place].

## 2012-04-07 LAB — GLUCOSE, CAPILLARY: Glucose-Capillary: 106 mg/dL — ABNORMAL HIGH (ref 70–99)

## 2012-04-07 NOTE — Progress Notes (Signed)
Physical Therapy Treatment Patient Details Name: Christopher [name] MRN: [id number] DOB: 09-Oct-1960 Today's Date: 04/07/2012 Time: 5784-6962 PT Time Calculation (min): 22 min  PT Assessment / Plan / Recommendation Comments on Treatment Session  Pt continues to make steady progress towards physical therapy goals; Pt will continue to benefit from skilled physical therapy to increase activity tolerance and maximize functional mobility    Follow Up Recommendations       Barriers to Discharge        Equipment Recommendations  None recommended by PT    Recommendations for Other Services    Frequency Min 5X/week   Plan Discharge plan remains appropriate    Precautions / Restrictions Precautions Precautions: Fall;Back Restrictions Weight Bearing Restrictions: No   Pertinent Vitals/Pain 7/10    Mobility  Bed Mobility Bed Mobility: Sit to Supine;Rolling Right Rolling Right: 5: Supervision Rolling Left: 5: Supervision Sit to Supine: 5: Supervision Details for Bed Mobility Assistance: log roll technique Transfers Sit to Stand: 5: Supervision;With upper extremity assist;From chair/3-in-1 Stand to Sit: 5: Supervision;With upper extremity assist;To bed Details for Transfer Assistance: good technique Ambulation/Gait Ambulation/Gait Assistance: 4: Min guard Ambulation Distance (Feet): 200 Feet Assistive device: Rolling walker Ambulation/Gait Assistance Details: Vcs for upright posture and increased cadence Gait Pattern: Step-through pattern;Decreased stride length Gait velocity: decreased      PT Goals Acute Rehab PT Goals PT Goal Formulation: With patient Time For Goal Achievement: 04/09/12 Potential to Achieve Goals: Good Pt will go Supine/Side to Sit: with modified independence PT Goal: Supine/Side to Sit - Progress: Progressing toward goal Pt will Stand: with modified independence PT Goal: Stand - Progress: Progressing toward goal Pt will Ambulate: >150 feet;with modified  independence;with least restrictive assistive device PT Goal: Ambulate - Progress: Progressing toward goal  Visit Information  Last PT Received On: 04/07/12 Assistance Needed: +1    Subjective Data  Subjective: I would love to go walking Patient Stated Goal: to go home   Cognition  Overall Cognitive Status: Appears within functional limits for tasks assessed/performed Arousal/Alertness: Awake/alert Orientation Level: Appears intact for tasks assessed Behavior During Session: [facility] for tasks performed    Balance     End of Session PT - End of Session Equipment Utilized During Treatment: Gait belt Activity Tolerance: Patient tolerated treatment well Patient left: with call bell/phone within reach;with family/visitor present;in bed   GP     Leola Brazil, PT DPT  [phone]  04/07/2012, 12:44 PM

## 2012-04-07 NOTE — Progress Notes (Signed)
Patient Christopher [name], 51 year old [place] American male feels positive about his back surgery.  Patient enjoys the emotional support of his wife.  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  Patient thanked Orthoptist for providing pastoral presence and conversation.  I will follow up as needed.

## 2012-04-07 NOTE — Progress Notes (Signed)
Occupational Therapy Treatment Patient Details Name: Christopher [name] MRN: [id number] DOB: 08/31/1961 Today's Date: 04/07/2012 Time: 4098-1191 OT Time Calculation (min): 32 min  OT Assessment / Plan / Recommendation Comments on Treatment Session Pt is knowledgeable in back precautions and availability and use of AE for LB ADL.  His bathroom at home is fully accessible.  Pt will have assist as needed at home.     Follow Up Recommendations  No OT follow up    Barriers to Discharge       Equipment Recommendations  None recommended by OT    Recommendations for Other Services    Frequency Min 2X/week   Plan Discharge plan remains appropriate    Precautions / Restrictions Precautions Precautions: Fall;Back Restrictions Weight Bearing Restrictions: No   Pertinent Vitals/Pain 8/10 back, premedicated, repositioned    ADL  Grooming: Supervision/safety;Wash/dry hands Where Assessed - Grooming: Unsupported standing Lower Body Bathing: Simulated;Other (comment) (instructed pt in use of long handled sponge) Where Assessed - Lower Body Bathing: Unsupported sitting;Supported sit to stand Lower Body Dressing: Performed;Minimal assistance;Other (comment) (instructed pt in use of reacher for donning pants and shoes) Where Assessed - Lower Body Dressing: Supported sit to stand Toilet Transfer: Performed;Min Pension scheme manager Method: Sit to stand;Stand Wellsite geologist: Raised toilet seat with arms (or 3-in-1 over toilet) Toileting - Clothing Manipulation and Hygiene: Simulated;Minimal assistance;Other (comment) (pt uses a bidet at home, educated in use of toilet tongs als) Where Assessed - Engineer, mining and Hygiene: Standing Tub/Shower Transfer: Simulated;Min guard Tub/Shower Transfer Method: Ambulating (pt's shower is completely accessible) Equipment Used: Gait belt;Rolling walker Transfers/Ambulation Related to ADLs: supervision with RW ADL Comments: Pt  is very reliant on his family at home.  He has good support and his home is set up for accessibility.    OT Diagnosis:    OT Problem List:   OT Treatment Interventions:     OT Goals ADL Goals Pt Will Perform Lower Body Bathing: with supervision;with caregiver independent in assisting;Sit to stand from chair;Unsupported;with adaptive equipment ADL Goal: Lower Body Dressing - Progress: Progressing toward goals Pt Will Transfer to Toilet: with modified independence;with DME;Ambulation ADL Goal: Toilet Transfer - Progress: Progressing toward goals Pt Will Perform Toileting - Hygiene: with supervision;with caregiver independent in assisting;with adaptive equipment;Sit to stand from 3-in-1/toilet;Standing at 3-in-1/toilet ADL Goal: Toileting - Hygiene - Progress: Progressing toward goals Additional ADL Goal #1: Pt will state 3.3 back precautions. ADL Goal: Additional Goal #1 - Progress: Met  Visit Information  Last OT Received On: 04/07/12 Assistance Needed: +1    Subjective Data      Prior Functioning       Cognition  Overall Cognitive Status: Appears within functional limits for tasks assessed/performed Arousal/Alertness: Awake/alert Orientation Level: Appears intact for tasks assessed Behavior During Session: [facility] for tasks performed    Mobility Bed Mobility Bed Mobility: Not assessed Transfers Transfers: Sit to Stand;Stand to Sit Sit to Stand: 5: Supervision;With upper extremity assist;From chair/3-in-1 Stand to Sit: 5: Supervision;To chair/3-in-1;With upper extremity assist   Exercises    Balance    End of Session OT - End of Session Activity Tolerance: Patient tolerated treatment well Patient left: Other (comment) (with PT)  GO     Evern Bio 04/07/2012, 9:10 AM [phone]

## 2012-04-07 NOTE — Progress Notes (Signed)
Seen in Room 5N04; vital signs stable, afebrile  S: c/o incisional pain, has been up with PT, c/o discomfort associated with TED hose, has been seen by OT also O: lying in bed, good lower ext strength A: stable P: may discontinue TED's, but must use SCD's which are not currently in place

## 2012-04-08 LAB — GLUCOSE, CAPILLARY
Glucose-Capillary: 80 mg/dL (ref 70–99)
Glucose-Capillary: 86 mg/dL (ref 70–99)

## 2012-04-08 NOTE — Progress Notes (Signed)
Seen in room 5N04 and in hall this afternoon  Temp. 100.1; VS's stable  S: C/O incisional pain  O: Walking in hall with walker; dressing dry  A: Satisfactory progress  P: discharge tomorrow

## 2012-04-08 NOTE — Progress Notes (Signed)
Physical Therapy Treatment Patient Details Name: Christopher [name] MRN: [id number] DOB: Jul 14, 1961 Today's Date: 04/08/2012 Time: 1340-1400 PT Time Calculation (min): 20 min  PT Assessment / Plan / Recommendation Comments on Treatment Session  Pt making steady progress towards PT goals.  Patient demonstrates good understanding of mobility status. Patient will benefit from continued skilled PT to maximize independence. Pt limited secondary to increased pain.  Rec d/c home with family and home PT.    Follow Up Recommendations  Home health PT    Barriers to Discharge        Equipment Recommendations  None recommended by PT    Recommendations for Other Services    Frequency Min 5X/week   Plan Discharge plan remains appropriate    Precautions / Restrictions Precautions Precautions: Fall;Back Restrictions Weight Bearing Restrictions: No   Pertinent Vitals/Pain 8/10    Mobility  Transfers Transfers: Sit to Stand;Stand to Sit Sit to Stand: 6: Modified independent (Device/Increase time) Stand to Sit: With upper extremity assist;6: Modified independent (Device/Increase time);To chair/3-in-1 Details for Transfer Assistance: good technique Ambulation/Gait Ambulation/Gait Assistance: 5: Supervision Ambulation Distance (Feet): 300 Feet Assistive device: Rolling walker Ambulation/Gait Assistance Details: VC's for upright posture and relaxation Gait Pattern: Step-through pattern;Decreased stride length Gait velocity: decreased    Exercises     PT Diagnosis:    PT Problem List:   PT Treatment Interventions:     PT Goals Acute Rehab PT Goals PT Goal Formulation: With patient Time For Goal Achievement: 04/09/12 Potential to Achieve Goals: Good Pt will Stand: with modified independence PT Goal: Stand - Progress: Met Pt will Ambulate: >150 feet;with modified independence;with least restrictive assistive device PT Goal: Ambulate - Progress: Progressing toward goal  Visit  Information  Last PT Received On: 04/08/12 Assistance Needed: +1    Subjective Data  Subjective: I'm still in a lot of pain and can't seem to get comfortable Patient Stated Goal: to go home   Cognition  Overall Cognitive Status: Appears within functional limits for tasks assessed/performed Arousal/Alertness: Awake/alert Orientation Level: Appears intact for tasks assessed Behavior During Session: [facility] for tasks performed    Balance     End of Session PT - End of Session Equipment Utilized During Treatment: Gait belt Activity Tolerance: Patient tolerated treatment well Patient left: with call bell/phone within reach;in bed Nurse Communication: Mobility status   GP     Fabio Asa 04/08/2012, 3:42 PM Charlotte Crumb, PT DPT  [phone]

## 2012-04-08 NOTE — Progress Notes (Signed)
Utilization review completed. Reality Dejonge, RN, BSN. 

## 2012-04-09 LAB — GLUCOSE, CAPILLARY: Glucose-Capillary: 102 mg/dL — ABNORMAL HIGH (ref 70–99)

## 2012-04-09 NOTE — Discharge Summary (Signed)
To be dictated

## 2012-04-21 NOTE — Discharge Summary (Signed)
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  Postoperatively he progressed satisfactorily and had no significant complications.  He was discharged on the 4th postop day on his previously prescribed meds.  Plan was to have staples removed from his left flank wound by a home health nurse.  He is to be followed in the office in about one month.    Condition on Discharge: stable

## 2014-01-18 ENCOUNTER — Other Ambulatory Visit: Payer: Self-pay | Admitting: Neurosurgery

## 2014-01-19 ENCOUNTER — Encounter (HOSPITAL_COMMUNITY): Payer: Self-pay | Admitting: Pharmacy Technician

## 2014-01-29 NOTE — Pre-Procedure Instructions (Signed)
Christopher [name]  01/29/2014   Your procedure is scheduled on:  June 5th, Friday.   Report to [facility] Admitting at 10:10  AM.   Call this number if you have problems the morning of surgery: [phone]   Remember:   Do not eat food or drink liquids after midnight Thursday.    Take these medicines the morning of surgery with A SIP OF WATER: Zyrtec, Valium or Zanaflex, Nucynta   Do not wear jewelry - no rings or watches.  Do not wear lotions or colognes.  You may NOT wear deodorant.   Men may shave face and neck.   Do not bring valuables to the hospital.  [facility] is not responsible for any belongings or valuables.               Contacts, dentures or bridgework may not be worn into surgery.  Leave suitcase in the car. After surgery it may be brought to your room.  For patients admitted to the hospital, discharge time is determined by your treatment team.               Name and phone number of your driver:    Special Instructions: "Preparing for Surgery" instruction sheet.   Please read over the following fact sheets that you were given: Pain Booklet, MRSA Information and Surgical Site Infection Prevention

## 2014-01-30 ENCOUNTER — Encounter (HOSPITAL_COMMUNITY)
Admission: RE | Admit: 2014-01-30 | Discharge: 2014-01-30 | Disposition: A | Discharge status: Home / Self Care | Payer: Worker's Compensation | Source: Ambulatory Visit | Attending: Neurosurgery | Admitting: Neurosurgery

## 2014-01-30 DIAGNOSIS — I10 Essential (primary) hypertension: Secondary | ICD-10-CM | POA: Insufficient documentation

## 2014-01-30 LAB — BASIC METABOLIC PANEL
BUN: 17 mg/dL (ref 6–23)
CALCIUM: 9.3 mg/dL (ref 8.4–10.5)
CHLORIDE: 100 meq/L (ref 96–112)
CO2: 26 mEq/L (ref 19–32)
CREATININE: 0.95 mg/dL (ref 0.50–1.35)
GFR calc non Af Amer: >90 mL/min (ref >90)
Glucose, Bld: 97 mg/dL (ref 70–99)
Potassium: 5 mEq/L (ref 3.7–5.3)
SODIUM: 138 meq/L (ref 137–147)

## 2014-01-30 LAB — CBC WITH DIFFERENTIAL/PLATELET
BASOS ABS: 0 10*3/uL (ref 0.0–0.1)
Basophils Relative: 0 % (ref 0–1)
Eosinophils Absolute: 0.2 10*3/uL (ref 0.0–0.7)
Eosinophils Relative: 3 % (ref 0–5)
HEMATOCRIT: 38.8 % — AB (ref 39.0–52.0)
Hemoglobin: 12.9 g/dL — ABNORMAL LOW (ref 13.0–17.0)
LYMPHS PCT: 34 % (ref 12–46)
Lymphs Abs: 2.2 10*3/uL (ref 0.7–4.0)
MCH: 29.9 pg (ref 26.0–34.0)
MCHC: 33.2 g/dL (ref 30.0–36.0)
MCV: 90 fL (ref 78.0–100.0)
Monocytes Absolute: 0.5 10*3/uL (ref 0.1–1.0)
Monocytes Relative: 8 % (ref 3–12)
NEUTROS ABS: 3.6 10*3/uL (ref 1.7–7.7)
Neutrophils Relative %: 55 % (ref 43–77)
PLATELETS: 253 10*3/uL (ref 150–400)
RBC: 4.31 MIL/uL (ref 4.22–5.81)
RDW: 13.6 % (ref 11.5–15.5)
WBC: 6.5 10*3/uL (ref 4.0–10.5)

## 2014-01-30 LAB — SURGICAL PCR SCREEN
MRSA, PCR: NEGATIVE
Staphylococcus aureus: NEGATIVE

## 2014-01-30 NOTE — Progress Notes (Signed)
This pt scored at an elevated risk for obstructive sleep apnea using the STOP BANG TOOL during a pre surgical testing 

## 2014-02-02 ENCOUNTER — Inpatient Hospital Stay (HOSPITAL_COMMUNITY): Payer: Worker's Compensation | Admitting: Certified Registered"

## 2014-02-02 ENCOUNTER — Encounter (HOSPITAL_COMMUNITY): Payer: Self-pay | Admitting: Anesthesiology

## 2014-02-02 ENCOUNTER — Encounter (HOSPITAL_COMMUNITY)
Admission: RE | Disposition: A | Discharge status: Home / Self Care | Payer: Self-pay | Source: Ambulatory Visit | Attending: Neurosurgery

## 2014-02-02 ENCOUNTER — Encounter (HOSPITAL_COMMUNITY): Payer: Worker's Compensation | Admitting: Certified Registered"

## 2014-02-02 ENCOUNTER — Inpatient Hospital Stay (HOSPITAL_COMMUNITY): Payer: Worker's Compensation

## 2014-02-02 ENCOUNTER — Ambulatory Visit (HOSPITAL_COMMUNITY)
Admission: RE | Admit: 2014-02-02 | Discharge: 2014-02-03 | Disposition: A | Discharge status: Home / Self Care | Payer: Worker's Compensation | Source: Ambulatory Visit | Attending: Neurosurgery | Admitting: Neurosurgery

## 2014-02-02 DIAGNOSIS — Z01812 Encounter for preprocedural laboratory examination: Secondary | ICD-10-CM | POA: Insufficient documentation

## 2014-02-02 DIAGNOSIS — I10 Essential (primary) hypertension: Secondary | ICD-10-CM | POA: Insufficient documentation

## 2014-02-02 DIAGNOSIS — Z01810 Encounter for preprocedural cardiovascular examination: Secondary | ICD-10-CM | POA: Insufficient documentation

## 2014-02-02 DIAGNOSIS — E119 Type 2 diabetes mellitus without complications: Secondary | ICD-10-CM | POA: Insufficient documentation

## 2014-02-02 DIAGNOSIS — Z23 Encounter for immunization: Secondary | ICD-10-CM | POA: Insufficient documentation

## 2014-02-02 DIAGNOSIS — M47812 Spondylosis without myelopathy or radiculopathy, cervical region: Principal | ICD-10-CM | POA: Diagnosis present

## 2014-02-02 DIAGNOSIS — M4722 Other spondylosis with radiculopathy, cervical region: Secondary | ICD-10-CM | POA: Diagnosis present

## 2014-02-02 DIAGNOSIS — M129 Arthropathy, unspecified: Secondary | ICD-10-CM | POA: Insufficient documentation

## 2014-02-02 HISTORY — PX: POSTERIOR CERVICAL FUSION/FORAMINOTOMY: SHX5038

## 2014-02-02 LAB — GLUCOSE, CAPILLARY
GLUCOSE-CAPILLARY: 89 mg/dL (ref 70–99)
GLUCOSE-CAPILLARY: 91 mg/dL (ref 70–99)
Glucose-Capillary: 102 mg/dL — ABNORMAL HIGH (ref 70–99)
Glucose-Capillary: 115 mg/dL — ABNORMAL HIGH (ref 70–99)

## 2014-02-02 SURGERY — POSTERIOR CERVICAL FUSION/FORAMINOTOMY LEVEL 2
Anesthesia: General | Site: Neck | Laterality: Left

## 2014-02-02 MED ORDER — OXYCODONE HCL 5 MG PO TABS: 5.0000 mg | ORAL_TABLET | Freq: Once | ORAL | Status: DC | PRN

## 2014-02-02 MED ORDER — LIDOCAINE HCL (CARDIAC) 20 MG/ML IV SOLN
INTRAVENOUS | Status: DC | PRN
  Administered 2014-02-02: 100 mg via INTRAVENOUS

## 2014-02-02 MED ORDER — PROMETHAZINE HCL 25 MG PO TABS
25.0000 mg | ORAL_TABLET | Freq: Three times a day (TID) | ORAL | Status: DC | PRN
  Administered 2014-02-03 (×2): 25 mg via ORAL
  Filled 2014-02-02 (×2): qty 1

## 2014-02-02 MED ORDER — BUPIVACAINE HCL (PF) 0.25 % IJ SOLN
INTRAMUSCULAR | Status: DC | PRN
  Administered 2014-02-02: 20 mL

## 2014-02-02 MED ORDER — HYDROMORPHONE HCL PF 1 MG/ML IJ SOLN
INTRAMUSCULAR | Status: AC
  Filled 2014-02-02: qty 1

## 2014-02-02 MED ORDER — ONDANSETRON HCL 4 MG/2ML IJ SOLN
4.0000 mg | INTRAMUSCULAR | Status: DC | PRN
  Administered 2014-02-02: 4 mg via INTRAVENOUS
  Filled 2014-02-02: qty 2

## 2014-02-02 MED ORDER — PHENYLEPHRINE HCL 10 MG/ML IJ SOLN
INTRAMUSCULAR | Status: DC | PRN
  Administered 2014-02-02 (×4): 40 ug via INTRAVENOUS

## 2014-02-02 MED ORDER — OXYCODONE-ACETAMINOPHEN 5-325 MG PO TABS: 1.0000 | ORAL_TABLET | ORAL | Status: DC | PRN

## 2014-02-02 MED ORDER — LACTATED RINGERS IV SOLN
INTRAVENOUS | Status: DC | PRN
  Administered 2014-02-02 (×2): via INTRAVENOUS

## 2014-02-02 MED ORDER — ARTIFICIAL TEARS OP OINT
TOPICAL_OINTMENT | OPHTHALMIC | Status: AC
  Filled 2014-02-02: qty 3.5

## 2014-02-02 MED ORDER — PHENOL 1.4 % MT LIQD: 1.0000 | OROMUCOSAL | Status: DC | PRN

## 2014-02-02 MED ORDER — LIDOCAINE HCL (CARDIAC) 20 MG/ML IV SOLN
INTRAVENOUS | Status: AC
  Filled 2014-02-02: qty 5

## 2014-02-02 MED ORDER — NEOSTIGMINE METHYLSULFATE 10 MG/10ML IV SOLN
INTRAVENOUS | Status: AC
  Filled 2014-02-02: qty 1

## 2014-02-02 MED ORDER — KETOROLAC TROMETHAMINE 30 MG/ML IJ SOLN
INTRAMUSCULAR | Status: AC
  Filled 2014-02-02: qty 1

## 2014-02-02 MED ORDER — FENTANYL CITRATE 0.05 MG/ML IJ SOLN
INTRAMUSCULAR | Status: AC
  Filled 2014-02-02: qty 5

## 2014-02-02 MED ORDER — SODIUM CHLORIDE 0.9 % IJ SOLN: 3.0000 mL | INTRAMUSCULAR | Status: DC | PRN

## 2014-02-02 MED ORDER — TAPENTADOL HCL 50 MG PO TABS
250.0000 mg | ORAL_TABLET | Freq: Three times a day (TID) | ORAL | Status: DC
  Administered 2014-02-02 – 2014-02-03 (×3): 250 mg via ORAL
  Filled 2014-02-02 (×6): qty 5

## 2014-02-02 MED ORDER — DIAZEPAM 5 MG PO TABS
5.0000 mg | ORAL_TABLET | Freq: Four times a day (QID) | ORAL | Status: DC | PRN
  Administered 2014-02-02: 5 mg via ORAL
  Filled 2014-02-02: qty 1

## 2014-02-02 MED ORDER — PROPOFOL 10 MG/ML IV BOLUS
INTRAVENOUS | Status: AC
  Filled 2014-02-02: qty 20

## 2014-02-02 MED ORDER — ARTIFICIAL TEARS OP OINT
TOPICAL_OINTMENT | OPHTHALMIC | Status: DC | PRN
  Administered 2014-02-02: 1 via OPHTHALMIC

## 2014-02-02 MED ORDER — BACITRACIN ZINC 500 UNIT/GM EX OINT
TOPICAL_OINTMENT | CUTANEOUS | Status: DC | PRN
  Administered 2014-02-02: 1 via TOPICAL

## 2014-02-02 MED ORDER — GLYCOPYRROLATE 0.2 MG/ML IJ SOLN
INTRAMUSCULAR | Status: DC | PRN
  Administered 2014-02-02: .4 mg via INTRAVENOUS

## 2014-02-02 MED ORDER — OXYCODONE HCL 5 MG PO TABS: 10.0000 mg | ORAL_TABLET | ORAL | Status: DC | PRN

## 2014-02-02 MED ORDER — FENTANYL CITRATE 0.05 MG/ML IJ SOLN
INTRAMUSCULAR | Status: DC | PRN
  Administered 2014-02-02 (×3): 50 ug via INTRAVENOUS
  Administered 2014-02-02: 150 ug via INTRAVENOUS

## 2014-02-02 MED ORDER — ALUM & MAG HYDROXIDE-SIMETH 200-200-20 MG/5ML PO SUSP: 30.0000 mL | Freq: Four times a day (QID) | ORAL | Status: DC | PRN

## 2014-02-02 MED ORDER — PNEUMOCOCCAL VAC POLYVALENT 25 MCG/0.5ML IJ INJ
0.5000 mL | INJECTION | INTRAMUSCULAR | Status: AC
  Administered 2014-02-03: 0.5 mL via INTRAMUSCULAR
  Filled 2014-02-02: qty 0.5

## 2014-02-02 MED ORDER — ACETAMINOPHEN 325 MG PO TABS: 650.0000 mg | ORAL_TABLET | ORAL | Status: DC | PRN

## 2014-02-02 MED ORDER — LORATADINE 10 MG PO TABS
10.0000 mg | ORAL_TABLET | Freq: Every day | ORAL | Status: DC
  Administered 2014-02-02 – 2014-02-03 (×2): 10 mg via ORAL
  Filled 2014-02-02 (×2): qty 1

## 2014-02-02 MED ORDER — METOCLOPRAMIDE HCL 5 MG/ML IJ SOLN
INTRAMUSCULAR | Status: AC
  Filled 2014-02-02: qty 2

## 2014-02-02 MED ORDER — SODIUM CHLORIDE 0.9 % IJ SOLN
3.0000 mL | Freq: Two times a day (BID) | INTRAMUSCULAR | Status: DC
  Administered 2014-02-02: 3 mL via INTRAVENOUS

## 2014-02-02 MED ORDER — LUBIPROSTONE 8 MCG PO CAPS
8.0000 ug | ORAL_CAPSULE | Freq: Two times a day (BID) | ORAL | Status: DC
  Filled 2014-02-02 (×4): qty 1

## 2014-02-02 MED ORDER — ACETAMINOPHEN 650 MG RE SUPP: 650.0000 mg | RECTAL | Status: DC | PRN

## 2014-02-02 MED ORDER — NEOSTIGMINE METHYLSULFATE 10 MG/10ML IV SOLN
INTRAVENOUS | Status: DC | PRN
  Administered 2014-02-02: 3 mg via INTRAVENOUS

## 2014-02-02 MED ORDER — METOCLOPRAMIDE HCL 5 MG/ML IJ SOLN
10.0000 mg | Freq: Once | INTRAMUSCULAR | Status: AC | PRN
  Administered 2014-02-02: 10 mg via INTRAVENOUS

## 2014-02-02 MED ORDER — METFORMIN HCL 500 MG PO TABS
500.0000 mg | ORAL_TABLET | Freq: Every day | ORAL | Status: DC
  Administered 2014-02-02: 500 mg via ORAL
  Filled 2014-02-02 (×2): qty 1

## 2014-02-02 MED ORDER — MAGNESIUM OXIDE 400 MG PO TABS
800.0000 mg | ORAL_TABLET | Freq: Every day | ORAL | Status: DC
  Administered 2014-02-02 – 2014-02-03 (×2): 800 mg via ORAL
  Filled 2014-02-02 (×2): qty 2

## 2014-02-02 MED ORDER — MIDAZOLAM HCL 2 MG/2ML IJ SOLN
INTRAMUSCULAR | Status: AC
  Filled 2014-02-02: qty 2

## 2014-02-02 MED ORDER — LACTATED RINGERS IV SOLN
INTRAVENOUS | Status: DC
  Administered 2014-02-02: 11:00:00 via INTRAVENOUS

## 2014-02-02 MED ORDER — GLYCOPYRROLATE 0.2 MG/ML IJ SOLN
INTRAMUSCULAR | Status: AC
  Filled 2014-02-02: qty 3

## 2014-02-02 MED ORDER — HYDROMORPHONE HCL PF 1 MG/ML IJ SOLN
0.5000 mg | INTRAMUSCULAR | Status: DC | PRN
  Administered 2014-02-02 – 2014-02-03 (×4): 1 mg via INTRAVENOUS
  Filled 2014-02-02 (×4): qty 1

## 2014-02-02 MED ORDER — ONDANSETRON HCL 4 MG/2ML IJ SOLN
INTRAMUSCULAR | Status: DC | PRN
Start: 2014-02-02 — End: 2014-02-02
  Administered 2014-02-02: 4 mg via INTRAVENOUS

## 2014-02-02 MED ORDER — THROMBIN 5000 UNITS EX SOLR
CUTANEOUS | Status: DC | PRN
  Administered 2014-02-02 (×2): 5000 [IU] via TOPICAL

## 2014-02-02 MED ORDER — OXYCODONE HCL 5 MG/5ML PO SOLN: 5.0000 mg | Freq: Once | ORAL | Status: DC | PRN

## 2014-02-02 MED ORDER — PHENYLEPHRINE HCL 10 MG/ML IJ SOLN
10.0000 mg | INTRAVENOUS | Status: DC | PRN
  Administered 2014-02-02: 25 ug/min via INTRAVENOUS

## 2014-02-02 MED ORDER — 0.9 % SODIUM CHLORIDE (POUR BTL) OPTIME
TOPICAL | Status: DC | PRN
  Administered 2014-02-02: 1000 mL

## 2014-02-02 MED ORDER — KETOROLAC TROMETHAMINE 30 MG/ML IJ SOLN
INTRAMUSCULAR | Status: DC | PRN
  Administered 2014-02-02: 30 mg via INTRAVENOUS

## 2014-02-02 MED ORDER — SODIUM CHLORIDE 0.9 % IR SOLN
Status: DC | PRN
  Administered 2014-02-02: 14:00:00

## 2014-02-02 MED ORDER — VANCOMYCIN HCL 10 G IV SOLR
1500.0000 mg | Freq: Once | INTRAVENOUS | Status: AC
  Administered 2014-02-02: 1500 mg via INTRAVENOUS
  Filled 2014-02-02 (×2): qty 1500

## 2014-02-02 MED ORDER — ONDANSETRON HCL 4 MG/2ML IJ SOLN
INTRAMUSCULAR | Status: AC
  Filled 2014-02-02: qty 2

## 2014-02-02 MED ORDER — PROPOFOL 10 MG/ML IV BOLUS
INTRAVENOUS | Status: DC | PRN
  Administered 2014-02-02: 50 mg via INTRAVENOUS
  Administered 2014-02-02: 200 mg via INTRAVENOUS
  Administered 2014-02-02: 50 mg via INTRAVENOUS

## 2014-02-02 MED ORDER — MIDAZOLAM HCL 5 MG/5ML IJ SOLN
INTRAMUSCULAR | Status: DC | PRN
  Administered 2014-02-02: 2 mg via INTRAVENOUS

## 2014-02-02 MED ORDER — DEXAMETHASONE SODIUM PHOSPHATE 10 MG/ML IJ SOLN
10.0000 mg | INTRAMUSCULAR | Status: AC
  Administered 2014-02-02: 10 mg via INTRAVENOUS
  Filled 2014-02-02: qty 1

## 2014-02-02 MED ORDER — TIZANIDINE HCL 4 MG PO TABS
4.0000 mg | ORAL_TABLET | Freq: Four times a day (QID) | ORAL | Status: DC | PRN
  Administered 2014-02-02 – 2014-02-03 (×2): 4 mg via ORAL
  Filled 2014-02-02 (×2): qty 1

## 2014-02-02 MED ORDER — LISINOPRIL 10 MG PO TABS
10.0000 mg | ORAL_TABLET | Freq: Every morning | ORAL | Status: DC
  Administered 2014-02-02 – 2014-02-03 (×2): 10 mg via ORAL
  Filled 2014-02-02 (×2): qty 1

## 2014-02-02 MED ORDER — ROCURONIUM BROMIDE 50 MG/5ML IV SOLN
INTRAVENOUS | Status: AC
  Filled 2014-02-02: qty 1

## 2014-02-02 MED ORDER — HYDROMORPHONE HCL PF 1 MG/ML IJ SOLN
0.2500 mg | INTRAMUSCULAR | Status: DC | PRN
  Administered 2014-02-02 (×4): 0.5 mg via INTRAVENOUS

## 2014-02-02 MED ORDER — SENNA 8.6 MG PO TABS
1.0000 | ORAL_TABLET | Freq: Two times a day (BID) | ORAL | Status: DC
  Administered 2014-02-02 – 2014-02-03 (×2): 8.6 mg via ORAL
  Filled 2014-02-02 (×3): qty 1

## 2014-02-02 MED ORDER — LISINOPRIL 10 MG PO TABS: 10.0000 mg | ORAL_TABLET | Freq: Every morning | ORAL | Status: DC

## 2014-02-02 MED ORDER — SODIUM CHLORIDE 0.9 % IV SOLN
1500.0000 mg | INTRAVENOUS | Status: AC
  Administered 2014-02-02: 1500 mg via INTRAVENOUS
  Filled 2014-02-02: qty 1500

## 2014-02-02 MED ORDER — MENTHOL 3 MG MT LOZG: 1.0000 | LOZENGE | OROMUCOSAL | Status: DC | PRN

## 2014-02-02 MED ORDER — ROCURONIUM BROMIDE 100 MG/10ML IV SOLN
INTRAVENOUS | Status: DC | PRN
  Administered 2014-02-02: 50 mg via INTRAVENOUS
  Administered 2014-02-02 (×3): 10 mg via INTRAVENOUS

## 2014-02-02 MED ORDER — HEMOSTATIC AGENTS (NO CHARGE) OPTIME
TOPICAL | Status: DC | PRN
  Administered 2014-02-02: 1 via TOPICAL

## 2014-02-02 SURGICAL SUPPLY — 56 items
BAG DECANTER FOR FLEXI CONT (MISCELLANEOUS) ×2 IMPLANT
BENZOIN TINCTURE PRP APPL 2/3 (GAUZE/BANDAGES/DRESSINGS) ×2 IMPLANT
BLADE 10 SAFETY STRL DISP (BLADE) IMPLANT
BLADE SURG ROTATE 9660 (MISCELLANEOUS) IMPLANT
BRUSH SCRUB EZ PLAIN DRY (MISCELLANEOUS) ×2 IMPLANT
BUR MATCHSTICK NEURO 3.0 LAGG (BURR) ×2 IMPLANT
CANISTER SUCT 3000ML (MISCELLANEOUS) ×2 IMPLANT
CONT SPEC 4OZ CLIKSEAL STRL BL (MISCELLANEOUS) ×2 IMPLANT
DERMABOND ADHESIVE PROPEN (GAUZE/BANDAGES/DRESSINGS) ×1
DERMABOND ADVANCED (GAUZE/BANDAGES/DRESSINGS) ×1
DERMABOND ADVANCED .7 DNX12 (GAUZE/BANDAGES/DRESSINGS) ×1 IMPLANT
DERMABOND ADVANCED .7 DNX6 (GAUZE/BANDAGES/DRESSINGS) ×1 IMPLANT
DRAPE C-ARM 42X72 X-RAY (DRAPES) ×4 IMPLANT
DRAPE LAPAROTOMY 100X72 PEDS (DRAPES) ×2 IMPLANT
DRAPE POUCH INSTRU U-SHP 10X18 (DRAPES) ×2 IMPLANT
DRSG OPSITE POSTOP 3X4 (GAUZE/BANDAGES/DRESSINGS) ×2 IMPLANT
DURAPREP 26ML APPLICATOR (WOUND CARE) ×2 IMPLANT
ELECT REM PT RETURN 9FT ADLT (ELECTROSURGICAL) ×2
ELECTRODE REM PT RTRN 9FT ADLT (ELECTROSURGICAL) ×1 IMPLANT
EVACUATOR 1/8 PVC DRAIN (DRAIN) IMPLANT
GAUZE SPONGE 4X4 16PLY XRAY LF (GAUZE/BANDAGES/DRESSINGS) IMPLANT
GLOVE BIO SURGEON STRL SZ8 (GLOVE) ×2 IMPLANT
GLOVE BIOGEL PI IND STRL 7.0 (GLOVE) ×1 IMPLANT
GLOVE BIOGEL PI INDICATOR 7.0 (GLOVE) ×1
GLOVE ECLIPSE 9.0 STRL (GLOVE) ×2 IMPLANT
GLOVE EXAM NITRILE LRG STRL (GLOVE) IMPLANT
GLOVE EXAM NITRILE MD LF STRL (GLOVE) IMPLANT
GLOVE EXAM NITRILE XL STR (GLOVE) IMPLANT
GLOVE EXAM NITRILE XS STR PU (GLOVE) IMPLANT
GLOVE OPTIFIT SS 6.5 STRL BRWN (GLOVE) ×6 IMPLANT
GOWN STRL REUS W/ TWL LRG LVL3 (GOWN DISPOSABLE) ×1 IMPLANT
GOWN STRL REUS W/ TWL XL LVL3 (GOWN DISPOSABLE) ×2 IMPLANT
GOWN STRL REUS W/TWL 2XL LVL3 (GOWN DISPOSABLE) IMPLANT
GOWN STRL REUS W/TWL LRG LVL3 (GOWN DISPOSABLE) ×1
GOWN STRL REUS W/TWL XL LVL3 (GOWN DISPOSABLE) ×2
KIT BASIN OR (CUSTOM PROCEDURE TRAY) ×2 IMPLANT
KIT ROOM TURNOVER OR (KITS) ×2 IMPLANT
NEEDLE HYPO 22GX1.5 SAFETY (NEEDLE) ×2 IMPLANT
NEEDLE SPNL 22GX3.5 QUINCKE BK (NEEDLE) ×2 IMPLANT
NS IRRIG 1000ML POUR BTL (IV SOLUTION) ×2 IMPLANT
PACK LAMINECTOMY NEURO (CUSTOM PROCEDURE TRAY) ×2 IMPLANT
PAD ARMBOARD 7.5X6 YLW CONV (MISCELLANEOUS) ×6 IMPLANT
PIN MAYFIELD SKULL DISP (PIN) ×2 IMPLANT
SPONGE GAUZE 4X4 12PLY (GAUZE/BANDAGES/DRESSINGS) ×2 IMPLANT
SPONGE LAP 4X18 X RAY DECT (DISPOSABLE) IMPLANT
SPONGE SURGIFOAM ABS GEL SZ50 (HEMOSTASIS) ×2 IMPLANT
STRIP CLOSURE SKIN 1/2X4 (GAUZE/BANDAGES/DRESSINGS) ×2 IMPLANT
SUT VIC AB 0 CT1 18XCR BRD8 (SUTURE) ×1 IMPLANT
SUT VIC AB 0 CT1 8-18 (SUTURE) ×1
SUT VIC AB 2-0 CT1 18 (SUTURE) ×2 IMPLANT
SUT VIC AB 3-0 SH 8-18 (SUTURE) ×2 IMPLANT
SYR 20ML ECCENTRIC (SYRINGE) ×2 IMPLANT
TAPE STRIPS DRAPE STRL (GAUZE/BANDAGES/DRESSINGS) ×2 IMPLANT
TOWEL OR 17X24 6PK STRL BLUE (TOWEL DISPOSABLE) ×2 IMPLANT
TOWEL OR 17X26 10 PK STRL BLUE (TOWEL DISPOSABLE) ×2 IMPLANT
WATER STERILE IRR 1000ML POUR (IV SOLUTION) ×2 IMPLANT

## 2014-02-02 NOTE — Transfer of Care (Signed)
Immediate Anesthesia Transfer of Care Note  Patient: Christopher [name]  Procedure(s) Performed: Procedure(s) with comments: POSTERIOR CERVICAL FUSION/FORAMINOTOMY LEVEL 2 (Left) - POSTERIOR CERVICAL FUSION/FORAMINOTOMY LEVEL 2, C6-7, C7-T1  Patient Location: PACU  Anesthesia Type:General  Level of Consciousness: awake, sedated and patient cooperative  Airway & Oxygen Therapy: Patient Spontanous Breathing and Patient connected to face mask oxygen  Post-op Assessment: Report given to PACU RN  Post vital signs: Reviewed and stable  Complications: No apparent anesthesia complications

## 2014-02-02 NOTE — Anesthesia Preprocedure Evaluation (Addendum)
Anesthesia Evaluation  Patient identified by MRN, date of birth, ID band Patient awake    Reviewed: Allergy & Precautions, H&P , NPO status , Patient's Chart, lab work & pertinent test results, reviewed documented beta blocker date and time   Airway Mallampati: II TM Distance: >3 FB Neck ROM: full    Dental  (+) Teeth Intact, Dental Advisory Given   Pulmonary neg pulmonary ROS,  breath sounds clear to auscultation        Cardiovascular hypertension, On Medications Rhythm:regular     Neuro/Psych  Neuromuscular disease negative psych ROS   GI/Hepatic negative GI ROS, Neg liver ROS,   Endo/Other  diabetes, Oral Hypoglycemic AgentsMorbid obesity  Renal/GU negative Renal ROS  negative genitourinary   Musculoskeletal   Abdominal   Peds  Hematology negative hematology ROS (+)   Anesthesia Other Findings See surgeon's H&P   Reproductive/Obstetrics negative OB ROS                          Anesthesia Physical Anesthesia Plan  ASA: III  Anesthesia Plan: General   Post-op Pain Management:    Induction: Intravenous  Airway Management Planned: Oral ETT  Additional Equipment:   Intra-op Plan:   Post-operative Plan: Extubation in OR  Informed Consent: I have reviewed the patients History and Physical, chart, labs and discussed the procedure including the risks, benefits and alternatives for the proposed anesthesia with the patient or authorized representative who has indicated his/her understanding and acceptance.   Dental Advisory Given  Plan Discussed with: CRNA and Surgeon  Anesthesia Plan Comments:         Anesthesia Quick Evaluation

## 2014-02-02 NOTE — Op Note (Signed)
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  Asst. Surgeon: Yetta Barre  Anesthesia: General  Indication: 53 year old male with severe left upper chamois pain paresthesias and numbness consistent primarily with the C8 radiculopathy no with elements of C7 radiculopathy as well. Workup demonstrates evidence of significant spondylosis and foraminal stenosis at both C67 and C7-T1. Patient presents now for laminotomy and decompressive foraminotomies in hopes of improving his symptoms.  Operative note: After induction of anesthesia, patient positioned prone onto bolsters with his head fixed in a neutral head position in a [place] pin headrest. Patient's the posterior cervical region prepped and draped sterilely. Incision made overlying the C7 level. This carried down sharply in the midline. Subperiosteal dissection then performed the left side exposing the lamina and facet joints of C6-7 and C7-T1 on the left. Retractor placed. X-ray taken. Levels confirmed. Decompressive laminotomy and foraminotomies were then performed using high-speed drill and Kerrison rongeurs to remove the inferior aspect of lamina of the medial aspect of the facet joint and the superior rim of the lamina below. Ligament flavum was elevated both levels. Underlying thecal sac was identified. Both the C7 and C8 nerve root were then fully decompress. She noted the compression the C8 nerve root was quite severe through the course the foramen. There is no evidence of injury to thecal sac and nerve roots. Wound is then irrigated out like solution. Gelfoam was placed topically for hemostasis. Wounds and close in layers with Vicryl sutures. Steri-Strips and  sterile dressings were applied. No apparent complications. Patient tolerated the procedure well and he returns to the recovery room postop.

## 2014-02-02 NOTE — Anesthesia Postprocedure Evaluation (Signed)
Anesthesia Post Note  Patient: Christopher [name]  Procedure(s) Performed: Procedure(s) (LRB): POSTERIOR CERVICAL FUSION/FORAMINOTOMY LEVEL 2 (Left)  Anesthesia type: General  Patient location: PACU  Post pain: Pain level controlled  Post assessment: Patient's Cardiovascular Status Stable  Last Vitals:  Filed Vitals:   02/02/14 1553  BP:   Pulse: 84  Temp:   Resp: 5    Post vital signs: Reviewed and stable  Level of consciousness: alert  Complications: No apparent anesthesia complications

## 2014-02-02 NOTE — OR Nursing (Signed)
Dr. Gelene Mink called to verify use of hydromorphone for post operative analgesia as it is listed as a patient allergy. Dr. Gelene Mink stated patient has received medication for prior procedure and tolerated administration. Will administer medication for post operative analgesia and monitor patient closely for side effects.

## 2014-02-02 NOTE — H&P (Signed)
Christopher SchwalbeLarry D [name] is an 53 y.o. male.   Chief Complaint: Left arm pain HPI: The patient is a 53 year old male status post previous cervical and lumbar surgeries. The patient is status post previous C5-6 and C6-7 anterior cervical decompression and fusion. Patient has chronic left upper cavity pain paresthesias and weakness mostly consistent with a left-sided C8 radicular pattern but with elements of C7 radicular pattern as well. He is failed all efforts at conservative management including numerous injections. Workup demonstrates evidence of marked spondylosis and stenosis on the left side in both C6-7 C7-T1. Patient presents now for decompressive surgery in hopes of improving his symptoms.  Past Medical History  Diagnosis Date  . Blood transfusion 2011  . Arthritis   . Hearing aid worn     L ear  . Wears glasses   . Hypertension     takes daily  . Diabetes mellitus     fasting blood sugar 85-100  . Neuromuscular disorder     Past Surgical History  Procedure Laterality Date  . Cervical discectomy      with grafting/plate  . Ulnar nerve repair      decompression, bilat  . Carpal tunnel release      bilat  . Tendon repair      R hand  . Tonsillectomy    . Back surgery  2012    5-6 back surgeries, surgery 2010- ended /w infection at wound, treatment with antibiotics via PICC line   . Anterior lumbar fusion  04/05/2012    Procedure: ANTERIOR LUMBAR FUSION 1 LEVEL;  Surgeon: Mat CarneSydney Michael Tooke, MD;  [facility];  Service: Orthopedics;  Laterality: N/A;  L3-4 ANTERIOR INTERBODY FUSION    Family History  Problem Relation Age of Onset  . Anesthesia problems Neg Hx   . Hypotension Neg Hx   . Malignant hyperthermia Neg Hx   . Pseudochol deficiency Neg Hx    Social History:  reports that he has never smoked. He has never used smokeless tobacco. He reports that he does not drink alcohol or use illicit drugs.  Allergies:  Allergies  Allergen Reactions  . Dilaudid [Hydromorphone  Hcl] Shortness Of Breath  . Flexeril [Cyclobenzaprine] Shortness Of Breath  . Meloxicam Shortness Of Breath  . Adhesive [Tape] Hives    Steri strips  . Allopurinol Other (See Comments)    Gi bleed  . Amitriptyline Other (See Comments)    Diff. breathing  . Aspirin Other (See Comments)    Gi bleed  . Codeine Other (See Comments)    Breathing problems.  . Fentanyl Other (See Comments)    Breathing difficulties. (Occured with patches)  . Hydrocodone Other (See Comments)    Gi upset.  . Morphine And Related Other (See Comments)    Stomach problems.  . Tramadol Other (See Comments)    Diff. breathing  . Celecoxib Rash  . Penicillins Rash    Medications Prior to Admission  Medication Sig Dispense Refill  . acetaminophen (TYLENOL ARTHRITIS PAIN) 650 MG CR tablet Take 1,300 mg by mouth daily as needed for pain.      . cetirizine (ZYRTEC) 10 MG tablet Take 10 mg by mouth every morning.       . cholecalciferol (VITAMIN D) 1000 UNITS tablet Take 2,000 Units by mouth daily.       . diazepam (VALIUM) 5 MG tablet Take 5 mg by mouth every 6 (six) hours as needed for muscle spasms. For anxiety.      .Marland Kitchen  lisinopril (PRINIVIL,ZESTRIL) 10 MG tablet Take 10 mg by mouth every morning.       . lubiprostone (AMITIZA) 8 MCG capsule Take 8 mcg by mouth 2 (two) times daily with a meal.      . magnesium oxide (MAG-OX) 400 MG tablet Take 800 mg by mouth daily.       . metFORMIN (GLUCOPHAGE) 500 MG tablet Take 500 mg by mouth daily with supper.       . Multiple Vitamins-Minerals (CENTRUM SILVER PO) Take 1 tablet by mouth every morning.      . naproxen sodium (ALEVE) 220 MG tablet Take 440 mg by mouth daily as needed (for pain).      . promethazine (PHENERGAN) 25 MG tablet Take 25 mg by mouth every 8 (eight) hours as needed for nausea. For nausea.      . Tapentadol HCl (NUCYNTA) 75 MG TABS Take 1 tablet by mouth 2 (two) times daily. For breakthrough pain      . Tapentadol HCl 250 MG TB12 Take 250 mg by mouth  every 8 (eight) hours.      Marland Kitchen tiZANidine (ZANAFLEX) 4 MG tablet Take 4 mg by mouth every 6 (six) hours as needed for muscle spasms.        Results for orders placed during the hospital encounter of 02/02/14 (from the past 48 hour(s))  GLUCOSE, CAPILLARY     Status: None   Collection Time    02/02/14 10:23 AM      Result Value Ref Range   Glucose-Capillary 89  70 - 99 mg/dL   Comment 1 Notify RN     No results found.  Review of Systems  Constitutional: Negative.   HENT: Negative.   Eyes: Negative.   Respiratory: Negative.   Cardiovascular: Negative.   Gastrointestinal: Negative.   Genitourinary: Negative.   Musculoskeletal: Negative.   Skin: Negative.   Neurological: Negative.   Endo/Heme/Allergies: Negative.   Psychiatric/Behavioral: Negative.     Blood pressure 156/76, pulse 91, temperature 97.7 F (36.5 C), temperature source Oral, resp. rate 20, height 5\' 11"  (1.803 m), weight 131.543 kg (290 lb), SpO2 99.00%. Physical Exam  Constitutional: He is oriented to person, place, and time. He appears well-developed and well-nourished. No distress.  HENT:  Head: Normocephalic and atraumatic.  Right Ear: External ear normal.  Left Ear: External ear normal.  Nose: Nose normal.  Mouth/Throat: Oropharynx is clear and moist. No oropharyngeal exudate.  Eyes: Conjunctivae and EOM are normal. Pupils are equal, round, and reactive to light. Right eye exhibits no discharge. Left eye exhibits no discharge.  Neck: Normal range of motion. No JVD present. No tracheal deviation present. No thyromegaly present.  Cardiovascular: Normal rate, regular rhythm, normal heart sounds and intact distal pulses.  Exam reveals no friction rub.   No murmur heard. Respiratory: Effort normal and breath sounds normal. No stridor. No respiratory distress. He has no wheezes.  GI: Soft. Bowel sounds are normal. He exhibits no distension. There is no tenderness.  Neurological: He is alert and oriented to person,  place, and time. He has normal reflexes. He displays normal reflexes. No cranial nerve deficit. He exhibits normal muscle tone. Coordination normal.  Extensive prior lumbar surgery. Marked diminished range of motion. Motor and sensory examination of lower extremities difficult secondary to poor effort and pain but overall appear intact. Patient with positive left-sided Spurling and left-sided hyperextension the intrinsic weakness with some wasting.  Skin: Skin is warm and dry. No rash noted. He is  not diaphoretic. No erythema. No pallor.  Psychiatric: He has a normal mood and affect. His behavior is normal. Judgment and thought content normal.     Assessment/Plan Left C6-7 and left C7-T1 spondylosis with radiculopathy. Plan left C6-7 and left C7-T1 decompressive laminotomy and foraminotomies. Risks and benefits of explained. Patient wishes to proceed.  Sherilyn Cooter A Wylan Gentzler 02/02/2014, 12:28 PM

## 2014-02-02 NOTE — Progress Notes (Addendum)
ANTIBIOTIC CONSULT NOTE - INITIAL  Pharmacy Consult for vancomycin Indication: surgical prophylaxis  Allergies  Allergen Reactions  . Dilaudid [Hydromorphone Hcl] Shortness Of Breath  . Flexeril [Cyclobenzaprine] Shortness Of Breath  . Meloxicam Shortness Of Breath  . Adhesive [Tape] Hives    Steri strips  . Allopurinol Other (See Comments)    Gi bleed  . Amitriptyline Other (See Comments)    Diff. breathing  . Aspirin Other (See Comments)    Gi bleed  . Codeine Other (See Comments)    Breathing problems.  . Fentanyl Other (See Comments)    Breathing difficulties. (Occured with patches)  . Hydrocodone Other (See Comments)    Gi upset.  . Morphine And Related Other (See Comments)    Stomach problems.  . Tramadol Other (See Comments)    Diff. breathing  . Celecoxib Rash  . Penicillins Rash    Patient Measurements: Height: 5\' 11"  (180.3 cm) Weight: 290 lb (131.543 kg) IBW/kg (Calculated) : 75.3   Vital Signs: Temp: 98.9 F (37.2 C) (06/05 1615) Temp src: Oral (06/05 1026) BP: 132/85 mmHg (06/05 1615) Pulse Rate: 94 (06/05 1615) Intake/Output from previous day:   Intake/Output from this shift: Total I/O In: 1940 [P.O.:240; I.V.:1700] Out: -   Labs: No results found for this basename: WBC, HGB, PLT, LABCREA, CREATININE,  in the last 72 hours Estimated Creatinine Clearance: 125.8 ml/min (by C-G formula based on Cr of 0.95). No results found for this basename: VANCOTROUGH, Leodis Binet, VANCORANDOM, GENTTROUGH, GENTPEAK, GENTRANDOM, TOBRATROUGH, TOBRAPEAK, TOBRARND, AMIKACINPEAK, AMIKACINTROU, AMIKACIN,  in the last 72 hours   Microbiology: Recent Results (from the past 720 hour(s))  SURGICAL PCR SCREEN     Status: None   Collection Time    01/30/14  9:36 AM      Result Value Ref Range Status   MRSA, PCR NEGATIVE  NEGATIVE Final   Staphylococcus aureus NEGATIVE  NEGATIVE Final   Comment:            The Xpert SA Assay (FDA     approved for NASAL specimens   in patients over 48 years of age),     is one component of     a comprehensive surveillance     program.  Test performance has     been validated by [employer] for patients greater     than or equal to 25 year old.     It is not intended     to diagnose infection nor to     guide or monitor treatment.    Medical History: Past Medical History  Diagnosis Date  . Blood transfusion 2011  . Arthritis   . Hearing aid worn     L ear  . Wears glasses   . Hypertension     takes daily  . Diabetes mellitus     fasting blood sugar 85-100  . Neuromuscular disorder     Medications:  Prescriptions prior to admission  Medication Sig Dispense Refill  . acetaminophen (TYLENOL ARTHRITIS PAIN) 650 MG CR tablet Take 1,300 mg by mouth daily as needed for pain.      . cetirizine (ZYRTEC) 10 MG tablet Take 10 mg by mouth every morning.       . cholecalciferol (VITAMIN D) 1000 UNITS tablet Take 2,000 Units by mouth daily.       . diazepam (VALIUM) 5 MG tablet Take 5 mg by mouth every 6 (six) hours as needed for muscle spasms. For anxiety.      Marland Kitchen  lisinopril (PRINIVIL,ZESTRIL) 10 MG tablet Take 10 mg by mouth every morning.       . lubiprostone (AMITIZA) 8 MCG capsule Take 8 mcg by mouth 2 (two) times daily with a meal.      . magnesium oxide (MAG-OX) 400 MG tablet Take 800 mg by mouth daily.       . metFORMIN (GLUCOPHAGE) 500 MG tablet Take 500 mg by mouth daily with supper.       . Multiple Vitamins-Minerals (CENTRUM SILVER PO) Take 1 tablet by mouth every morning.      . naproxen sodium (ALEVE) 220 MG tablet Take 440 mg by mouth daily as needed (for pain).      . promethazine (PHENERGAN) 25 MG tablet Take 25 mg by mouth every 8 (eight) hours as needed for nausea. For nausea.      . Tapentadol HCl (NUCYNTA) 75 MG TABS Take 1 tablet by mouth 2 (two) times daily. For breakthrough pain      . Tapentadol HCl 250 MG TB12 Take 250 mg by mouth every 8 (eight) hours.      Marland Kitchen. tiZANidine (ZANAFLEX) 4 MG  tablet Take 4 mg by mouth every 6 (six) hours as needed for muscle spasms.       Assessment: 53 yo man to continue vancomycin s/p back surgery for 1 dose.  He received a dose of vancomycin 1500 mg @ 12:53.  His SrCr 0.95, CrCl >90 ml/min  Goal of Therapy:  Vancomycin trough level 10-15 mcg/ml  Plan:  Vancomycin 1500 mg IV X 1.   Conal Shetley Poteet Syna Gad 02/02/2014,4:41 PM

## 2014-02-02 NOTE — Anesthesia Procedure Notes (Signed)
Procedure Name: Intubation Date/Time: 02/02/2014 12:48 PM Performed by: Lanell Matar Pre-anesthesia Checklist: Patient being monitored, Suction available, Emergency Drugs available, Patient identified and Timeout performed Patient Re-evaluated:Patient Re-evaluated prior to inductionOxygen Delivery Method: Circle system utilized Preoxygenation: Pre-oxygenation with 100% oxygen Intubation Type: IV induction Ventilation: Mask ventilation without difficulty and Oral airway inserted - appropriate to patient size Laryngoscope Size: Hyacinth Meeker and 2 Grade View: Grade II Tube type: Oral Number of attempts: 1 Airway Equipment and Method: Stylet Placement Confirmation: ETT inserted through vocal cords under direct vision,  breath sounds checked- equal and bilateral,  positive ETCO2 and CO2 detector Secured at: 22 cm Tube secured with: Tape Dental Injury: Teeth and Oropharynx as per pre-operative assessment

## 2014-02-02 NOTE — Brief Op Note (Signed)
02/02/2014  2:33 PM  PATIENT:  Christopher [name]  53 y.o. male  PRE-OPERATIVE DIAGNOSIS:  spondylosis  POST-OPERATIVE DIAGNOSIS:  spondylosis  PROCEDURE:  Procedure(s) with comments: POSTERIOR CERVICAL FUSION/FORAMINOTOMY LEVEL 2 (Left) - POSTERIOR CERVICAL FUSION/FORAMINOTOMY LEVEL 2, C6-7, C7-T1  SURGEON:  Surgeon(s) and Role:    * Temple Pacini, MD - Primary  PHYSICIAN ASSISTANT:   ASSISTANTS: Yetta Barre   ANESTHESIA:   general  EBL:  Total I/O In: 1000 [I.V.:1000] Out: -   BLOOD ADMINISTERED:none  DRAINS: none   LOCAL MEDICATIONS USED:  MARCAINE     SPECIMEN:  No Specimen  DISPOSITION OF SPECIMEN:  N/A  COUNTS:  YES  TOURNIQUET:  * No tourniquets in log *  DICTATION: .Dragon Dictation  PLAN OF CARE: Admit for overnight observation  PATIENT DISPOSITION:  PACU - hemodynamically stable.   Delay start of Pharmacological VTE agent (>24hrs) due to surgical blood loss or risk of bleeding: yes

## 2014-02-02 NOTE — Plan of Care (Signed)
Problem: Consults Goal: Diagnosis - Spinal Surgery Outcome: Completed/Met Date Met:  02/02/14 Cervical Spine Fusion

## 2014-02-03 LAB — GLUCOSE, CAPILLARY: Glucose-Capillary: 106 mg/dL — ABNORMAL HIGH (ref 70–99)

## 2014-02-03 NOTE — Progress Notes (Signed)
Orthopedic Tech Progress Note Patient Details:  Christopher [name] 1961/03/15 [id number] [employer] Type of Ortho Device: Soft collar Ortho Device/Splint Interventions: Application   [name] 02/03/2014, 8:03 AM

## 2014-02-03 NOTE — Discharge Summary (Signed)
Physician Discharge Summary  Patient ID: Christopher SchwalbeLarry D [name] MRN: [id number] DOB/AGE: [birth year] 53 y.o.  Admit date: 02/02/2014 Discharge date: 02/03/2014  Admission Diagnoses: Cervical spondylosis C6-7 C7-T1 on the left with radiculopathy  Discharge Diagnoses: Cervical spondylosis C6-7 C7-T1 on left with radiculopathy Principal Problem:   Cervical spondylosis without myelopathy Active Problems:   Cervical spondylosis with radiculopathy   Discharged Condition: good  Hospital Course: Patient was admitted to undergo surgical decompression via laminotomy and foraminotomies C6-7 and C7-T1 on the left. He tolerated procedure well.  Consults: None  Significant Diagnostic Studies: None  Treatments: surgery: Laminotomy and foraminotomies C6-7-1 left with operating microscope microdissection technique  Discharge Exam: Blood pressure 120/76, pulse 101, temperature 99.1 F (37.3 C), temperature source Oral, resp. rate 18, height 5\' 11"  (1.803 m), weight 131.543 kg (290 lb), SpO2 92.00%. Incision is clean and dry area motor function is good in the upper extremities including the triceps and intrinsics on the left side.  Disposition: 01-Home or Self Care  Discharge Instructions   Call MD for:  redness, tenderness, or signs of infection (pain, swelling, redness, odor or green/yellow discharge around incision site)    Complete by:  As directed      Call MD for:  severe uncontrolled pain    Complete by:  As directed      Call MD for:  temperature >100.4    Complete by:  As directed      Diet - low sodium heart healthy    Complete by:  As directed      Increase activity slowly    Complete by:  As directed             Medication List         ALEVE 220 MG tablet  Generic drug:  naproxen sodium  Take 440 mg by mouth daily as needed (for pain).     CENTRUM SILVER PO  Take 1 tablet by mouth every morning.     cetirizine 10 MG tablet  Commonly known as:  ZYRTEC  Take 10 mg by mouth  every morning.     cholecalciferol 1000 UNITS tablet  Commonly known as:  VITAMIN D  Take 2,000 Units by mouth daily.     diazepam 5 MG tablet  Commonly known as:  VALIUM  Take 5 mg by mouth every 6 (six) hours as needed for muscle spasms. For anxiety.     lisinopril 10 MG tablet  Commonly known as:  PRINIVIL,ZESTRIL  Take 10 mg by mouth every morning.     lubiprostone 8 MCG capsule  Commonly known as:  AMITIZA  Take 8 mcg by mouth 2 (two) times daily with a meal.     magnesium oxide 400 MG tablet  Commonly known as:  MAG-OX  Take 800 mg by mouth daily.     metFORMIN 500 MG tablet  Commonly known as:  GLUCOPHAGE  Take 500 mg by mouth daily with supper.     NUCYNTA 75 MG Tabs  Generic drug:  Tapentadol HCl  Take 1 tablet by mouth 2 (two) times daily. For breakthrough pain     Tapentadol HCl 250 MG Tb12  Take 250 mg by mouth every 8 (eight) hours.     promethazine 25 MG tablet  Commonly known as:  PHENERGAN  Take 25 mg by mouth every 8 (eight) hours as needed for nausea. For nausea.     tiZANidine 4 MG tablet  Commonly known as:  ZANAFLEX  Take 4 mg  by mouth every 6 (six) hours as needed for muscle spasms.     TYLENOL ARTHRITIS PAIN 650 MG CR tablet  Generic drug:  acetaminophen  Take 1,300 mg by mouth daily as needed for pain.         SignedBarnett Abu 02/03/2014, 7:39 AM

## 2014-02-03 NOTE — Discharge Instructions (Signed)

## 2014-02-03 NOTE — Evaluation (Signed)
Occupational Therapy Evaluation Patient Details Name: Christopher [name] MRN: [id number] DOB: 04/22/1961 Today's Date: 02/03/2014    History of Present Illness Pt admitted for C6-T1 posterior cervical fusion due to LUE pain and spasm. Pt with history of multiple spinal surgeries over last 9 years after work related injury   Clinical Impression   Pt presents with tingling and weakness in L UE.  He is mobilizing well with supervision.  Pt is dependent in bathing and dressing at baseline.  He has all necessary DME at home.  Instructed in cervical precautions related to ADL and issued and instructed in home program for L UE strengthening using therapy putty.  No further OT needs.    Follow Up Recommendations  No OT follow up    Equipment Recommendations  None recommended by OT    Recommendations for Other Services       Precautions / Restrictions Precautions Precautions: Fall;Cervical Precaution Comments: instructed in cervical precautions related to ADL Required Braces or Orthoses: Cervical Brace Cervical Brace: For comfort      Mobility Bed Mobility Overal bed mobility: Modified Independent                Transfers Overall transfer level: Modified independent                    Balance                                            ADL Overall ADL's : Needs assistance/impaired;At baseline Eating/Feeding: Set up;Sitting (assisted to open packages)   Grooming: Wash/dry hands;Wash/dry face;Supervision/safety;Standing                               Functional mobility during ADLs: Supervision/safety General ADL Comments: Pt's wife is also his paid caregiver.  Assists with bathing, dressing.  Pt has a bidet for pericare.       Vision                     Perception     Praxis      Pertinent Vitals/Pain 4-5/10 neck, premedicated     Hand Dominance Right   Extremity/Trunk Assessment Upper Extremity Assessment Upper  Extremity Assessment: LUE deficits/detail LUE Deficits / Details: 4-/5 gross grasp and pinch, weakness of base of thumb LUE Sensation:  (tingling primarily on ulnar side) LUE Coordination: decreased fine motor   Lower Extremity Assessment Lower Extremity Assessment: Defer to PT evaluation   Cervical / Trunk Assessment Cervical / Trunk Assessment: Kyphotic   Communication Communication Communication: No difficulties   Cognition Arousal/Alertness: Awake/alert Behavior During Therapy: WFL for tasks assessed/performed Overall Cognitive Status: Within Functional Limits for tasks assessed                     General Comments       Exercises       Shoulder Instructions      Home Living Family/patient expects to be discharged to:: Private residence Living Arrangements: Spouse/significant other Available Help at Discharge: Available 24 hours/day;Family Type of Home: House Home Access: Ramped entrance     Home Layout: One level               Home Equipment: Environmental consultant - 2 wheels;Cane - single point;Hand held shower head;Grab bars - tub/shower;Grab  bars - toilet;Toilet riser;Shower seat;Wheelchair - power;Other (comment)          Prior Functioning/Environment Level of Independence: Needs assistance  Gait / Transfers Assistance Needed: pt typically walks with cane and uses power chair for long distance ADL's / Homemaking Assistance Needed: Pt's wife is also his paid caregiver and assists with bathing, dressing and performs homemaking and meal prep. Pt has a bidet for pericare.Issued and instructed pt in theraputty exercises with medium soft putty for L hand. Pt reporting tingling and frequent dropping of items from L hand.        OT Diagnosis:     OT Problem List:     OT Treatment/Interventions:      OT Goals(Current goals can be found in the care plan section) Acute Rehab OT Goals Patient Stated Goal: hopeful that this will be his last surgery  OT Frequency:      Barriers to D/C:            Co-evaluation              End of Session    Activity Tolerance: Patient tolerated treatment well Patient left: in chair;with call bell/phone within reach   Time: 0832-0850 OT Time Calculation (min): 18 min Charges:  OT General Charges $OT Visit: 1 Procedure OT Evaluation $Initial OT Evaluation Tier I: 1 Procedure OT Treatments $Therapeutic Exercise: 8-22 mins G-Codes: OT G-codes **NOT FOR INPATIENT CLASS** Functional Assessment Tool Used: clinical judgement Functional Limitation: Self care Self Care Current Status (T0354(G8987): At least 60 percent but less than 80 percent impaired, limited or restricted Self Care Goal Status ([id number](G8988): At least 60 percent but less than 80 percent impaired, limited or restricted Self Care Discharge Status [phone]([id number]): At least 60 percent but less than 80 percent impaired, limited or restricted  [place]ton BailiffJulie Lynn Kenzlie Disch 02/03/2014, 9:22 AM [phone]

## 2014-02-03 NOTE — Progress Notes (Signed)
Pt. Alert and oriented,follows simple instructions, denies pain. Incision area without swelling, redness or S/S of infection. Voiding adequate clear yellow urine. Moving all extremities well and vitals stable and documented. Cervical surgery notes instructions given to patient and family member for home safety and precautions. Pt. and family stated understanding of instructions given.

## 2014-02-03 NOTE — Evaluation (Signed)
Physical Therapy Evaluation/ Discharge Patient Details Name: Christopher [name] MRN: [id number] DOB: [birth year] Today's Date: 02/03/2014   History of Present Illness  Pt admitted for C6-T1 posterior cervical fusion due to LUE pain and spasm. Pt with history of multiple spinal surgeries over last 9 years after work related injury  Clinical Impression  Pt very pleasant and mobilizing well with awareness of precautions and restrictions from prior surgeries. Pt educated for RW use and posture with wife able to assist with all ADLs and pt already has necessary DME. Handout provided with education and pt requested collar which arrived and was placed during session. At this time no further needs as education completed and pt with necessary assist with continued spasm and decreased function of LUE. Pt aware and agreeable to all above.    Follow Up Recommendations No PT follow up    Equipment Recommendations  None recommended by PT    Recommendations for Other Services       Precautions / Restrictions Precautions Precautions: Fall;Cervical Precaution Comments: handout provided Required Braces or Orthoses: Cervical Brace Cervical Brace: For comfort      Mobility  Bed Mobility Overal bed mobility: Modified Independent                Transfers Overall transfer level: Modified independent                  Ambulation/Gait Ambulation/Gait assistance: Supervision Ambulation Distance (Feet): 450 Feet Assistive device: Rolling walker (2 wheeled) Gait Pattern/deviations: Step-through pattern;Decreased stride length;Trunk flexed   Gait velocity interpretation: Below normal speed for age/gender General Gait Details: cues for posture and position in RW  Stairs            Wheelchair Mobility    Modified Rankin (Stroke Patients Only)       Balance                                             Pertinent Vitals/Pain 5/10 muscle spasm shoulder to neck     Home Living Family/patient expects to be discharged to:: Private residence Living Arrangements: Spouse/significant other Available Help at Discharge: Available 24 hours/day;Family Type of Home: House Home Access: Ramped entrance     Home Layout: One level Home Equipment: Environmental consultant - 2 wheels;Cane - single point;Hand held shower head;Grab bars - tub/shower;Grab bars - toilet;Toilet riser;Shower seat;Wheelchair - power;Other (comment) (roll in shower)      Prior Function Level of Independence: Needs assistance   Gait / Transfers Assistance Needed: pt typically walks with cane and uses power chair for long distance  ADL's / Homemaking Assistance Needed: wife assists with all aDLs since repeat spinal surgeries        Hand Dominance   Dominant Hand: Right    Extremity/Trunk Assessment   Upper Extremity Assessment: Generalized weakness           Lower Extremity Assessment: Generalized weakness      Cervical / Trunk Assessment: Kyphotic  Communication   Communication: No difficulties  Cognition Arousal/Alertness: Awake/alert Behavior During Therapy: WFL for tasks assessed/performed Overall Cognitive Status: Within Functional Limits for tasks assessed                      General Comments      Exercises        Assessment/Plan    PT Assessment Patent does  not need any further PT services  PT Diagnosis     PT Problem List    PT Treatment Interventions     PT Goals (Current goals can be found in the Care Plan section) Acute Rehab PT Goals PT Goal Formulation: No goals set, d/c therapy    Frequency     Barriers to discharge        Co-evaluation               End of Session Equipment Utilized During Treatment: Cervical collar Activity Tolerance: Patient tolerated treatment well Patient left: in chair;with call bell/phone within reach Nurse Communication: Mobility status;Precautions    Functional Assessment Tool Used: clinical  judgement Functional Limitation: Mobility: Walking and moving around Mobility: Walking and Moving Around Current Status ([id number](G8978): At least 1 percent but less than 20 percent impaired, limited or restricted Mobility: Walking and Moving Around Goal Status [phone]([id number]): At least 1 percent but less than 20 percent impaired, limited or restricted Mobility: Walking and Moving Around Discharge Status [phone]([phone]): At least 1 percent but less than 20 percent impaired, limited or restricted    Time: 0753-0815 PT Time Calculation (min): 22 min   Charges:   PT Evaluation $Initial PT Evaluation Tier I: 1 Procedure PT Treatments $Therapeutic Activity: 8-22 mins   PT G Codes:   Functional Assessment Tool Used: clinical judgement Functional Limitation: Mobility: Walking and moving around    IntelMaija B [name] 02/03/2014, 8:48 AM Christopher [name], PT [phone]

## 2014-02-06 ENCOUNTER — Encounter (HOSPITAL_COMMUNITY): Payer: Self-pay | Admitting: Neurosurgery
